# Patient Record
Sex: Female | Born: 1945 | Race: White | Hispanic: No | Marital: Married | Smoking: Former smoker
Health system: Southern US, Community
[De-identification: ages and names within clinical notes are randomized; demographics above are authoritative.]

## PROBLEM LIST (undated history)

## (undated) DIAGNOSIS — Z8489 Family history of other specified conditions: Secondary | ICD-10-CM

## (undated) DIAGNOSIS — Z9889 Other specified postprocedural states: Secondary | ICD-10-CM

## (undated) DIAGNOSIS — R112 Nausea with vomiting, unspecified: Secondary | ICD-10-CM

## (undated) DIAGNOSIS — C50919 Malignant neoplasm of unspecified site of unspecified female breast: Secondary | ICD-10-CM

## (undated) DIAGNOSIS — Z5189 Encounter for other specified aftercare: Secondary | ICD-10-CM

## (undated) DIAGNOSIS — I639 Cerebral infarction, unspecified: Secondary | ICD-10-CM

## (undated) DIAGNOSIS — E785 Hyperlipidemia, unspecified: Secondary | ICD-10-CM

## (undated) DIAGNOSIS — I10 Essential (primary) hypertension: Secondary | ICD-10-CM

## (undated) DIAGNOSIS — E119 Type 2 diabetes mellitus without complications: Secondary | ICD-10-CM

## (undated) HISTORY — DX: Essential (primary) hypertension: I10

## (undated) HISTORY — PX: BREAST BIOPSY: SHX20

## (undated) HISTORY — PX: TONSILLECTOMY: SUR1361

## (undated) HISTORY — DX: Encounter for other specified aftercare: Z51.89

## (undated) HISTORY — DX: Malignant neoplasm of unspecified site of unspecified female breast: C50.919

## (undated) HISTORY — DX: Cerebral infarction, unspecified: I63.9

---

## 1996-07-18 DIAGNOSIS — C50919 Malignant neoplasm of unspecified site of unspecified female breast: Secondary | ICD-10-CM

## 1996-07-18 DIAGNOSIS — Z5189 Encounter for other specified aftercare: Secondary | ICD-10-CM

## 1996-07-18 DIAGNOSIS — IMO0001 Reserved for inherently not codable concepts without codable children: Secondary | ICD-10-CM

## 1996-07-18 DIAGNOSIS — I639 Cerebral infarction, unspecified: Secondary | ICD-10-CM

## 1996-07-18 HISTORY — DX: Encounter for other specified aftercare: Z51.89

## 1996-07-18 HISTORY — DX: Cerebral infarction, unspecified: I63.9

## 1996-07-18 HISTORY — PX: BREAST LUMPECTOMY: SHX2

## 1996-07-18 HISTORY — DX: Reserved for inherently not codable concepts without codable children: IMO0001

## 1996-07-18 HISTORY — PX: BONE MARROW TRANSPLANT: SHX200

## 1996-07-18 HISTORY — DX: Malignant neoplasm of unspecified site of unspecified female breast: C50.919

## 2001-03-06 ENCOUNTER — Other Ambulatory Visit: Admission: RE | Admit: 2001-03-06 | Discharge: 2001-03-06 | Payer: Self-pay | Admitting: *Deleted

## 2004-06-25 ENCOUNTER — Encounter: Admission: RE | Admit: 2004-06-25 | Discharge: 2004-06-25 | Payer: Self-pay | Admitting: General Surgery

## 2005-07-05 ENCOUNTER — Encounter: Admission: RE | Admit: 2005-07-05 | Discharge: 2005-07-05 | Payer: Self-pay | Admitting: General Surgery

## 2005-07-18 HISTORY — PX: MASTECTOMY: SHX3

## 2005-08-12 ENCOUNTER — Encounter (INDEPENDENT_AMBULATORY_CARE_PROVIDER_SITE_OTHER): Payer: Self-pay | Admitting: *Deleted

## 2005-08-12 ENCOUNTER — Encounter (INDEPENDENT_AMBULATORY_CARE_PROVIDER_SITE_OTHER): Payer: Self-pay | Admitting: Diagnostic Radiology

## 2005-08-12 ENCOUNTER — Encounter: Admission: RE | Admit: 2005-08-12 | Discharge: 2005-08-12 | Payer: Self-pay | Admitting: General Surgery

## 2005-08-26 ENCOUNTER — Ambulatory Visit (HOSPITAL_COMMUNITY): Admission: RE | Admit: 2005-08-26 | Discharge: 2005-08-26 | Payer: Self-pay | Admitting: General Surgery

## 2005-09-08 ENCOUNTER — Encounter: Admission: RE | Admit: 2005-09-08 | Discharge: 2005-09-08 | Payer: Self-pay | Admitting: General Surgery

## 2005-09-12 ENCOUNTER — Ambulatory Visit (HOSPITAL_BASED_OUTPATIENT_CLINIC_OR_DEPARTMENT_OTHER): Admission: RE | Admit: 2005-09-12 | Discharge: 2005-09-13 | Payer: Self-pay | Admitting: General Surgery

## 2005-09-13 ENCOUNTER — Encounter (INDEPENDENT_AMBULATORY_CARE_PROVIDER_SITE_OTHER): Payer: Self-pay | Admitting: *Deleted

## 2005-10-05 ENCOUNTER — Ambulatory Visit: Payer: Self-pay | Admitting: Oncology

## 2005-11-21 ENCOUNTER — Encounter: Admission: RE | Admit: 2005-11-21 | Discharge: 2005-11-21 | Payer: Self-pay | Admitting: Oncology

## 2005-12-13 ENCOUNTER — Ambulatory Visit: Payer: Self-pay | Admitting: Oncology

## 2005-12-14 LAB — COMPREHENSIVE METABOLIC PANEL
ALT: 26 U/L (ref 0–40)
AST: 23 U/L (ref 0–37)
Albumin: 4.7 g/dL (ref 3.5–5.2)
Alkaline Phosphatase: 51 U/L (ref 39–117)
BUN: 7 mg/dL (ref 6–23)
CO2: 30 mEq/L (ref 19–32)
Calcium: 10 mg/dL (ref 8.4–10.5)
Chloride: 101 mEq/L (ref 96–112)
Creatinine, Ser: 0.7 mg/dL (ref 0.4–1.2)
Glucose, Bld: 169 mg/dL — ABNORMAL HIGH (ref 70–99)
Potassium: 4 mEq/L (ref 3.5–5.3)
Sodium: 139 mEq/L (ref 135–145)
Total Bilirubin: 0.8 mg/dL (ref 0.3–1.2)
Total Protein: 6.6 g/dL (ref 6.0–8.3)

## 2006-04-07 ENCOUNTER — Ambulatory Visit: Payer: Self-pay | Admitting: Oncology

## 2006-04-11 LAB — CBC WITH DIFFERENTIAL (CANCER CENTER ONLY)
BASO#: 0.1 10*3/uL (ref 0.0–0.2)
EOS%: 2.9 % (ref 0.0–7.0)
HGB: 13.8 g/dL (ref 11.6–15.9)
MCH: 29.5 pg (ref 26.0–34.0)
MCHC: 33.4 g/dL (ref 32.0–36.0)
MONO%: 6.9 % (ref 0.0–13.0)
NEUT#: 3.5 10*3/uL (ref 1.5–6.5)
NEUT%: 49.3 % (ref 39.6–80.0)
RDW: 12.4 % (ref 10.5–14.6)

## 2006-04-12 LAB — COMPREHENSIVE METABOLIC PANEL
ALT: 29 U/L (ref 0–40)
AST: 30 U/L (ref 0–37)
Albumin: 4.4 g/dL (ref 3.5–5.2)
Alkaline Phosphatase: 62 U/L (ref 39–117)
BUN: 13 mg/dL (ref 6–23)
CO2: 30 mEq/L (ref 19–32)
Calcium: 9.9 mg/dL (ref 8.4–10.5)
Chloride: 100 mEq/L (ref 96–112)
Creatinine, Ser: 0.8 mg/dL (ref 0.40–1.20)
Glucose, Bld: 107 mg/dL — ABNORMAL HIGH (ref 70–99)
Potassium: 3.8 mEq/L (ref 3.5–5.3)
Sodium: 135 mEq/L (ref 135–145)
Total Bilirubin: 1.3 mg/dL — ABNORMAL HIGH (ref 0.3–1.2)
Total Protein: 6.4 g/dL (ref 6.0–8.3)

## 2006-04-12 LAB — CANCER ANTIGEN 27.29: CA 27.29: 15 U/mL (ref 0–39)

## 2006-04-12 LAB — LACTATE DEHYDROGENASE: LDH: 162 U/L (ref 94–250)

## 2006-10-12 ENCOUNTER — Ambulatory Visit: Payer: Self-pay | Admitting: Oncology

## 2006-10-16 LAB — COMPREHENSIVE METABOLIC PANEL
ALT: 37 U/L — ABNORMAL HIGH (ref 0–35)
AST: 31 U/L (ref 0–37)
Albumin: 4.6 g/dL (ref 3.5–5.2)
Alkaline Phosphatase: 55 U/L (ref 39–117)
BUN: 12 mg/dL (ref 6–23)
CO2: 27 mEq/L (ref 19–32)
Calcium: 10.8 mg/dL — ABNORMAL HIGH (ref 8.4–10.5)
Chloride: 100 mEq/L (ref 96–112)
Creatinine, Ser: 0.76 mg/dL (ref 0.40–1.20)
Glucose, Bld: 134 mg/dL — ABNORMAL HIGH (ref 70–99)
Potassium: 4.1 mEq/L (ref 3.5–5.3)
Sodium: 139 mEq/L (ref 135–145)
Total Bilirubin: 0.9 mg/dL (ref 0.3–1.2)
Total Protein: 6.7 g/dL (ref 6.0–8.3)

## 2006-10-16 LAB — CBC WITH DIFFERENTIAL (CANCER CENTER ONLY)
EOS%: 2.8 % (ref 0.0–7.0)
MCH: 30 pg (ref 26.0–34.0)
MCHC: 33.4 g/dL (ref 32.0–36.0)
MONO%: 7.9 % (ref 0.0–13.0)
NEUT#: 4.3 10*3/uL (ref 1.5–6.5)
Platelets: 288 10*3/uL (ref 145–400)
RBC: 5.05 10*6/uL (ref 3.70–5.32)

## 2006-10-16 LAB — LACTATE DEHYDROGENASE: LDH: 163 U/L (ref 94–250)

## 2006-10-16 LAB — CANCER ANTIGEN 27.29: CA 27.29: 24 U/mL (ref 0–39)

## 2006-10-24 LAB — BASIC METABOLIC PANEL - CANCER CENTER ONLY
BUN, Bld: 12 mg/dL (ref 7–22)
CO2: 29 mEq/L (ref 18–33)
Calcium: 9.8 mg/dL (ref 8.0–10.3)
Chloride: 98 mEq/L (ref 98–108)
Creat: 0.8 mg/dl (ref 0.6–1.2)
Glucose, Bld: 129 mg/dL — ABNORMAL HIGH (ref 73–118)
Potassium: 4.2 mEq/L (ref 3.3–4.7)
Sodium: 134 mEq/L (ref 128–145)

## 2007-04-12 ENCOUNTER — Ambulatory Visit: Payer: Self-pay | Admitting: Oncology

## 2007-04-16 LAB — COMPREHENSIVE METABOLIC PANEL
ALT: 40 U/L — ABNORMAL HIGH (ref 0–35)
AST: 30 U/L (ref 0–37)
Albumin: 4.7 g/dL (ref 3.5–5.2)
Alkaline Phosphatase: 61 U/L (ref 39–117)
BUN: 11 mg/dL (ref 6–23)
CO2: 27 mEq/L (ref 19–32)
Calcium: 10.2 mg/dL (ref 8.4–10.5)
Chloride: 97 mEq/L (ref 96–112)
Creatinine, Ser: 0.72 mg/dL (ref 0.40–1.20)
Glucose, Bld: 214 mg/dL — ABNORMAL HIGH (ref 70–99)
Potassium: 4.5 mEq/L (ref 3.5–5.3)
Sodium: 137 mEq/L (ref 135–145)
Total Bilirubin: 1.2 mg/dL (ref 0.3–1.2)
Total Protein: 6.7 g/dL (ref 6.0–8.3)

## 2007-04-16 LAB — CBC WITH DIFFERENTIAL (CANCER CENTER ONLY)
BASO#: 0.1 10*3/uL (ref 0.0–0.2)
BASO%: 1.1 % (ref 0.0–2.0)
EOS%: 3 % (ref 0.0–7.0)
Eosinophils Absolute: 0.2 10*3/uL (ref 0.0–0.5)
HCT: 42.8 % (ref 34.8–46.6)
HGB: 14.3 g/dL (ref 11.6–15.9)
LYMPH#: 2.8 10*3/uL (ref 0.9–3.3)
LYMPH%: 38.1 % (ref 14.0–48.0)
MCH: 29.6 pg (ref 26.0–34.0)
MCHC: 33.5 g/dL (ref 32.0–36.0)
MCV: 88 fL (ref 81–101)
MONO#: 0.6 10*3/uL (ref 0.1–0.9)
MONO%: 7.7 % (ref 0.0–13.0)
NEUT#: 3.7 10*3/uL (ref 1.5–6.5)
NEUT%: 50.1 % (ref 39.6–80.0)
Platelets: 254 10*3/uL (ref 145–400)
RBC: 4.85 10*6/uL (ref 3.70–5.32)
RDW: 11.9 % (ref 10.5–14.6)
WBC: 7.4 10*3/uL (ref 3.9–10.0)

## 2007-04-16 LAB — LACTATE DEHYDROGENASE: LDH: 147 U/L (ref 94–250)

## 2007-04-16 LAB — CANCER ANTIGEN 27.29: CA 27.29: 25 U/mL (ref 0–39)

## 2007-08-13 ENCOUNTER — Encounter: Admission: RE | Admit: 2007-08-13 | Discharge: 2007-08-13 | Payer: Self-pay | Admitting: General Surgery

## 2007-11-09 ENCOUNTER — Ambulatory Visit: Payer: Self-pay | Admitting: Oncology

## 2007-11-13 LAB — COMPREHENSIVE METABOLIC PANEL
ALT: 30 U/L (ref 0–35)
AST: 27 U/L (ref 0–37)
Albumin: 4.8 g/dL (ref 3.5–5.2)
Alkaline Phosphatase: 60 U/L (ref 39–117)
BUN: 17 mg/dL (ref 6–23)
CO2: 22 mEq/L (ref 19–32)
Calcium: 9.8 mg/dL (ref 8.4–10.5)
Chloride: 100 mEq/L (ref 96–112)
Creatinine, Ser: 0.83 mg/dL (ref 0.40–1.20)
Glucose, Bld: 265 mg/dL — ABNORMAL HIGH (ref 70–99)
Potassium: 4.1 mEq/L (ref 3.5–5.3)
Sodium: 134 mEq/L — ABNORMAL LOW (ref 135–145)
Total Bilirubin: 1 mg/dL (ref 0.3–1.2)
Total Protein: 6.9 g/dL (ref 6.0–8.3)

## 2007-11-13 LAB — CBC WITH DIFFERENTIAL (CANCER CENTER ONLY)
BASO#: 0 10*3/uL (ref 0.0–0.2)
EOS%: 3.8 % (ref 0.0–7.0)
HCT: 42.1 % (ref 34.8–46.6)
HGB: 14.2 g/dL (ref 11.6–15.9)
LYMPH#: 2.6 10*3/uL (ref 0.9–3.3)
LYMPH%: 36.6 % (ref 14.0–48.0)
MCH: 28.9 pg (ref 26.0–34.0)
MCHC: 33.7 g/dL (ref 32.0–36.0)
MCV: 86 fL (ref 81–101)
NEUT%: 50.7 % (ref 39.6–80.0)

## 2007-11-13 LAB — CANCER ANTIGEN 27.29: CA 27.29: 26 U/mL (ref 0–39)

## 2008-05-26 ENCOUNTER — Ambulatory Visit: Payer: Self-pay | Admitting: Oncology

## 2008-05-27 LAB — CBC WITH DIFFERENTIAL (CANCER CENTER ONLY)
BASO#: 0.1 10*3/uL (ref 0.0–0.2)
BASO%: 1.1 % (ref 0.0–2.0)
Eosinophils Absolute: 0.2 10*3/uL (ref 0.0–0.5)
HCT: 41.6 % (ref 34.8–46.6)
HGB: 14.2 g/dL (ref 11.6–15.9)
LYMPH#: 2.5 10*3/uL (ref 0.9–3.3)
MCV: 88 fL (ref 81–101)
MONO#: 0.4 10*3/uL (ref 0.1–0.9)
NEUT%: 48 % (ref 39.6–80.0)
WBC: 6.3 10*3/uL (ref 3.9–10.0)

## 2008-05-27 LAB — CMP (CANCER CENTER ONLY)
ALT(SGPT): 32 U/L (ref 10–47)
AST: 34 U/L (ref 11–38)
Albumin: 4.4 g/dL (ref 3.3–5.5)
Alkaline Phosphatase: 62 U/L (ref 26–84)
BUN, Bld: 12 mg/dL (ref 7–22)
CO2: 29 mEq/L (ref 18–33)
Calcium: 10 mg/dL (ref 8.0–10.3)
Chloride: 100 mEq/L (ref 98–108)
Creat: 0.3 mg/dl — ABNORMAL LOW (ref 0.6–1.2)
Glucose, Bld: 156 mg/dL — ABNORMAL HIGH (ref 73–118)
Potassium: 4.1 mEq/L (ref 3.3–4.7)
Sodium: 133 mEq/L (ref 128–145)
Total Bilirubin: 1.1 mg/dl (ref 0.20–1.60)
Total Protein: 7.4 g/dL (ref 6.4–8.1)

## 2008-05-27 LAB — CANCER ANTIGEN 27.29: CA 27.29: 22 U/mL (ref 0–39)

## 2008-12-02 ENCOUNTER — Ambulatory Visit: Payer: Self-pay | Admitting: Oncology

## 2008-12-03 LAB — CMP (CANCER CENTER ONLY)
ALT(SGPT): 29 U/L (ref 10–47)
AST: 28 U/L (ref 11–38)
Albumin: 3.9 g/dL (ref 3.3–5.5)
Alkaline Phosphatase: 61 U/L (ref 26–84)
BUN, Bld: 13 mg/dL (ref 7–22)
CO2: 29 mEq/L (ref 18–33)
Calcium: 9.4 mg/dL (ref 8.0–10.3)
Chloride: 101 mEq/L (ref 98–108)
Creat: 0.8 mg/dl (ref 0.6–1.2)
Glucose, Bld: 169 mg/dL — ABNORMAL HIGH (ref 73–118)
Potassium: 4 mEq/L (ref 3.3–4.7)
Sodium: 139 mEq/L (ref 128–145)
Total Bilirubin: 0.9 mg/dl (ref 0.20–1.60)
Total Protein: 7.2 g/dL (ref 6.4–8.1)

## 2008-12-03 LAB — CBC WITH DIFFERENTIAL (CANCER CENTER ONLY)
BASO%: 1 % (ref 0.0–2.0)
Eosinophils Absolute: 0.3 10*3/uL (ref 0.0–0.5)
HCT: 41.2 % (ref 34.8–46.6)
LYMPH#: 2.5 10*3/uL (ref 0.9–3.3)
MONO#: 0.5 10*3/uL (ref 0.1–0.9)
Platelets: 253 10*3/uL (ref 145–400)
RBC: 4.7 10*6/uL (ref 3.70–5.32)
RDW: 12.3 % (ref 10.5–14.6)
WBC: 6.2 10*3/uL (ref 3.9–10.0)

## 2008-12-03 LAB — CANCER ANTIGEN 27.29: CA 27.29: 23 U/mL (ref 0–39)

## 2008-12-05 ENCOUNTER — Ambulatory Visit (HOSPITAL_COMMUNITY): Admission: RE | Admit: 2008-12-05 | Discharge: 2008-12-05 | Payer: Self-pay | Admitting: Oncology

## 2009-03-02 ENCOUNTER — Ambulatory Visit: Payer: Self-pay | Admitting: Genetic Counselor

## 2009-06-29 ENCOUNTER — Ambulatory Visit: Payer: Self-pay | Admitting: Oncology

## 2010-08-07 ENCOUNTER — Encounter: Payer: Self-pay | Admitting: General Surgery

## 2010-10-26 LAB — GLUCOSE, CAPILLARY: Glucose-Capillary: 162 mg/dL — ABNORMAL HIGH (ref 70–99)

## 2010-11-22 ENCOUNTER — Encounter (INDEPENDENT_AMBULATORY_CARE_PROVIDER_SITE_OTHER): Payer: Self-pay | Admitting: General Surgery

## 2010-12-03 NOTE — Op Note (Signed)
NAME:  Anna Frank, Anna Frank             ACCOUNT NO.:  [id number]   MEDICAL RECORD NO.:  [id number]          PATIENT TYPE:  AMB   LOCATION:  DSC                          FACILITY:  [facility]   PHYSICIAN:  Rose Phi. Maple Hudson, M.D.   DATE OF BIRTH:  1945/09/14   DATE OF PROCEDURE:  09/12/2005  DATE OF DISCHARGE:                                 OPERATIVE REPORT   PREOPERATIVE DIAGNOSIS:  Recurrent carcinoma of the right breast.   POSTOPERATIVE DIAGNOSIS:  Recurrent carcinoma of the right breast.   OPERATION:  Right total mastectomy.   SURGEON:  Dr. Francina Ames.   ANESTHESIA:  General.   OPERATIVE PROCEDURE:  After suitable general anesthesia was induced, the  patient was placed in the supine position with the arms extended on the arm  board. The right breast and chest area were prepped and draped in usual  fashion. A transverse elliptical incision was then outlined with a marking  pencil incorporating the nipple-areolar complex and as much of the previous  lumpectomy scar as I could. Incision was made and then we dissected the  flaps in the standard fashion. The superior flap was dissected to near the  level of the clavicle using the cautery and then we went medially to the  medial border of the sternum and then inferiorly to the inframammary fold at  the level of the rectus fascia and laterally to the latissimus dorsi muscle.   We then removed the breast by dissecting from medial to lateral  incorporating the pectoralis fascia and at the lateral margin, the breast  detached.   We had good hemostasis and thoroughly irrigated the field with saline.   One 19-French Harrison Mons drain was inserted and brought out through a separate  stab wound.  I then quilted down the upper flap with several 3-0 Vicryl  sutures.   The skin was then closed with staples and dressing applied. The patient  transferred to the recovery room in satisfactory condition having tolerated  procedure well.      Rose Phi.  Maple Hudson, M.D.  Electronically Signed     PRY/MEDQ  D:  09/12/2005  T:  09/13/2005  Job:  [id number]

## 2011-08-30 ENCOUNTER — Encounter (INDEPENDENT_AMBULATORY_CARE_PROVIDER_SITE_OTHER): Payer: Self-pay | Admitting: General Surgery

## 2011-08-30 ENCOUNTER — Ambulatory Visit (INDEPENDENT_AMBULATORY_CARE_PROVIDER_SITE_OTHER): Payer: Medicare Other | Admitting: General Surgery

## 2011-08-30 VITALS — BP 122/76 | HR 114 | Temp 97.5°F | Ht 62.5 in | Wt 145.8 lb

## 2011-08-30 DIAGNOSIS — Z853 Personal history of malignant neoplasm of breast: Secondary | ICD-10-CM | POA: Insufficient documentation

## 2011-08-30 NOTE — Patient Instructions (Signed)
Your physical exam today is negative for any signs of cancer on the right or the left.  I think that you are a high risk patient for developing cancer in the left breast, however. I agree with Dr. Yolanda Bonine that you should make an appointment to see Dr. Marikay Alar Magrinat for review of your breast cancer history and further discussion of your high risk status.  I will try to get the MRI, MRI report, MRI biopsy report, and the reports from the open biopsy that Dr. Ashley Royalty performed in 2012.  You Will get a left breast mammogram in 6 months, and I will see you at that time.

## 2011-08-30 NOTE — Progress Notes (Addendum)
Patient ID: Anna Frank, female   DOB: [birth year], 66 y.o.   MRN: [id number]  Chief Complaint  Patient presents with  . Breast Cancer Long Term Follow Up    HPI Anna Frank is a 66 y.o. female.  She returns for a long-term followup of her right breast cancer.  This patient was originally a patient of Francina Ames, and had a right partial mastectomy and axillary lymph node dissection in July of 1998 for a stage T2 N2 carcinoma. She had stem cell transplant along with chemotherapy and Femara following a right partial mastectomy. Following all of that she developed small recurrence and underwent right total mastectomy by Francina Ames in February 2007. She has no known recurrent disease today.  She has been followed by Dr. Dossie Der in medical oncology at [facility]. She has been followed by Jeralyn Ruths. Her primary care physician is in [place].  I reviewed some notes from [facility] office and it appears that in May of 2012 she had an MRI and did an MRI guided biopsy. It appeared to Dr. Ashley Royalty did an  open biopsy revealing  lobular neoplasia. I do not have any details of that.  She had a left breast mammogram at [facility] on Feb.5,2013 which shows dense breasts, linear density at the site of recent biopsy, BI-RADS category 3. Six-month followup recommended.  She states that Dr. Yolanda Bonine has recommended that she see Dr. Marikay Alar Magrinat for an opinion.   Apparently Dr. Yolanda Bonine has requested records from that but I do not have the records. She and her husband are here today. She has no new complaints about her breast. HPI  Past Medical History  Diagnosis Date  . Blood transfusion   . Cancer   . Diabetes mellitus   . Hypertension   . Stroke     Past Surgical History  Procedure Date  . Breast surgery 1998    lumpectomy  . Breast surgery 2007    mastectomy    Family History  Problem Relation Age of Onset  . Heart disease Mother   . Cancer Sister    ovarian    Social History History  Substance Use Topics  . Smoking status: Former Games developer  . Smokeless tobacco: Former Neurosurgeon    Quit date: 08/29/1996  . Alcohol Use: Yes     3 per week    No Known Allergies  Current Outpatient Prescriptions  Medication Sig Dispense Refill  . Ascorbic Acid (VITAMIN C) 1000 MG tablet Take 1,000 mg by mouth daily.      Marland Kitchen aspirin 81 MG tablet Take 81 mg by mouth daily.      Marland Kitchen b complex vitamins tablet Take 1 tablet by mouth daily.      . Ca Phosphate-Cholecalciferol (CALTRATE GUMMY BITES PO) Take 600 mg by mouth daily.      . cholecalciferol (VITAMIN D) 1000 UNITS tablet Take 1,000 Units by mouth daily.      Marland Kitchen exemestane (AROMASIN) 25 MG tablet Take 25 mg by mouth daily after breakfast.        . fish oil-omega-3 fatty acids 1000 MG capsule Take 1 g by mouth daily.      Marland Kitchen glimepiride (AMARYL) 2 MG tablet Take 2 mg by mouth daily before breakfast.        . LISINOPRIL PO Take 25 mg by mouth daily.        Marland Kitchen METFORMIN HCL PO Take 2,000 mg by mouth 2 (two) times daily.        Marland Kitchen  Pioglitazone HCl (ACTOS PO) Take 15 mg by mouth daily.        Marland Kitchen SIMVASTATIN PO Take by mouth daily.        . sitaGLIPtan (JANUVIA) 100 MG tablet Take 100 mg by mouth daily.        . vitamin E 1000 UNIT capsule Take 1,000 Units by mouth daily.        Review of Systems Review of Systems 12 system review of systems is performed and is negative except as described above. Blood pressure 122/76, pulse 114, temperature 97.5 F (36.4 C), temperature source Temporal, height 5' 2.5" (1.588 m), weight 145 lb 12.8 oz (66.134 kg), SpO2 98.00%.  Physical Exam Physical Exam  Constitutional: She is oriented to person, place, and time. She appears well-developed and well-nourished. No distress.  Neck: Normal range of motion. Neck supple. No JVD present. No tracheal deviation present. No thyromegaly present.  Cardiovascular: Normal rate and regular rhythm.   Pulmonary/Chest:     Lymphadenopathy:    She has no cervical adenopathy.  Neurological: She is alert and oriented to person, place, and time. Coordination normal.  Skin: Skin is warm and dry. No rash noted. No erythema. No pallor.  Psychiatric: She has a normal mood and affect. Her behavior is normal. Judgment and thought content normal.    Data Reviewed I have reviewed the recent mammogram and mammogram reports. I reviewed a clinical summary from Dr. Yolanda Bonine Re: the investigations at [facility] last year. I reviewed our old records.  Assessment    History right partial mastectomy and axillary lymph node dissection 1998 for pathologic stage TII N2 cancer.  Status post stem cell transplant.  Status post right total mastectomy 2007 for recurrent cancer. No evidence of recurrent cancer right chest wall to date.  Undocumented history of left breast biopsy with showing lobular neoplasia at [facility] in May 2012  Recent left mammogram at [facility] looks okay, BI-RADS category 3, a Graves 6 month followup  This is a high-risk patient.    Plan    The patient is going to call for an appointment to see Dr. Marikay Alar Magrinat electively regarding her high risk status for breast cancer.  Left breast mammogram in 6 months  We will attempt to get all the records from [facility].  ADDENDUM (09/05/11) - records received from [facility].     Left Breast biopsy performed 11/27/10.    Pathology shows Atypical Lobular Hyperplasia, sclerosing adenosis, FCD. No in-situ or invasive cancer identified.       MRI performed 04/26/11 shows no evidence of recurrence in right mastectomy bed, post surgical changes left breast, no suspicious lesions.  Perhaps she should have another MRI in about 6 months.  Unless I learn something new from her records, there does not appear to be any indication for further surgery on her left breast at this time. Close clinical followup is warranted, however, because of her high risk status       Nicolo Tomko  M 08/30/2011, 2:43 PM

## 2011-09-01 ENCOUNTER — Encounter (INDEPENDENT_AMBULATORY_CARE_PROVIDER_SITE_OTHER): Payer: Self-pay

## 2011-09-02 ENCOUNTER — Telehealth (INDEPENDENT_AMBULATORY_CARE_PROVIDER_SITE_OTHER): Payer: Self-pay

## 2011-09-02 NOTE — Telephone Encounter (Signed)
Received records from [facility] and to [facility] to sign.

## 2011-09-26 ENCOUNTER — Encounter (INDEPENDENT_AMBULATORY_CARE_PROVIDER_SITE_OTHER): Payer: Self-pay

## 2011-09-27 ENCOUNTER — Encounter (INDEPENDENT_AMBULATORY_CARE_PROVIDER_SITE_OTHER): Payer: Self-pay

## 2012-02-28 ENCOUNTER — Other Ambulatory Visit: Payer: Self-pay | Admitting: Radiology

## 2012-02-29 ENCOUNTER — Ambulatory Visit (INDEPENDENT_AMBULATORY_CARE_PROVIDER_SITE_OTHER): Payer: Medicare Other | Admitting: General Surgery

## 2012-02-29 ENCOUNTER — Other Ambulatory Visit (INDEPENDENT_AMBULATORY_CARE_PROVIDER_SITE_OTHER): Payer: Self-pay | Admitting: General Surgery

## 2012-02-29 ENCOUNTER — Encounter (INDEPENDENT_AMBULATORY_CARE_PROVIDER_SITE_OTHER): Payer: Self-pay | Admitting: General Surgery

## 2012-02-29 VITALS — BP 132/80 | HR 80 | Temp 97.0°F | Resp 16 | Ht 62.5 in | Wt 154.0 lb

## 2012-02-29 DIAGNOSIS — D4860 Neoplasm of uncertain behavior of unspecified breast: Secondary | ICD-10-CM | POA: Insufficient documentation

## 2012-02-29 DIAGNOSIS — Z853 Personal history of malignant neoplasm of breast: Secondary | ICD-10-CM

## 2012-02-29 NOTE — Patient Instructions (Signed)
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 Considering the history of locally advanced cancer of the right breast, your stem cell transplant, and a strong family history of breast cancer, I agree with you that it is reasonable to consider prophylactic left mastectomy.  We will tentatively schedule you for a left total mastectomy with sentinel node biopsy. At your request we're going to schedule this in October.  We have discussed long-term medical oncology evaluation and you have requested referral back to Dr. Glenford Peers, and so we will arrange for an office visit with him before we do the prophylactic mastectomy. You can discuss management and risk reduction with him and discussed the Aromasin use with him.  Return to see Dr. Derrell Lolling in 4 weeks, after the visit with Dr. Mariel Sleet but before the surgery.     Total or Modified Radical Mastectomy  Care After Refer to this sheet in the next few weeks. These instructions provide you with information on caring for yourself after your procedure. Your caregiver may also give you more specific instructions. Your treatment has been planned according to current medical practices, but problems sometimes occur. Call your caregiver if you have any problems or questions after your procedure. ACTIVITY  Your caregiver will advise you when you may resume strenuous activities, driving, and sports.   After the drain(s) are removed, you may do light housework. Avoid heavy lifting, carrying, or pushing. You should not be lifting anything heavier than 5 lbs.   Take frequent rest periods. You may tire more easily than usual.   Always rest and elevate the arm affected by your surgery for a period of time equal to your activity time.   Continue doing the exercises given to you by the physical therapist/occupational therapist even after full range of motion has returned. The amount of time this takes will vary from person to person.   After  normal range of motion has returned, some stiffness and soreness may persist for 2-3 months. This is normal and will subside.   Begin sports or strenuous activities in moderation. This will give you a chance to rebuild your endurance. Continue to be cautious of heavy lifting or carrying (no more than 10 lbs.) with your affected arm.   You may return to work as recommended by your caregiver.  NUTRITION  You may resume your normal diet.   Make sure you drink plenty of fluids (6-8 glasses a day).   Eat a well-balanced diet. Including daily portions of food from government recommended food groups:   Grains.   Vegetables.   Fruits.   Milk.   Meat & beans.   Oils.  Visit DateT[employer].nl for more information HYGIENE  You may wash your hair.   If your incision (cut from surgery) is closed, you may shower or tub bathe, unless instructed otherwise by your doctor.  FEVER  If you feel feverish or have shaking chills, take your temperature. If your temperature is 102 F (38.9 C) or above, call your caregiver. The fever may mean there is an infection.   If you call early, infection can be treated with antibiotics and hospitalization may be avoided.  PAIN CONTROL  Mild discomfort may occur.   You may need to take an over-the-counter pain medication or a medication prescribed by your caregiver.   Call your caregiver if you experience increased pain.  INCISION CARE  Check your incision daily for increased redness, drainage, swelling, or separation of skin.   Call your  caregiver if any of the above are noted.  ARM AND HAND CARE  If the lymph nodes under your arm were removed with a modified radical mastectomy, there may be a greater tendency for the arm to swell.   Try to avoid having blood pressures taken, blood drawn, or injections given in the affected arm. This is the arm on the same side as the surgery.   Use hand lotion to soften cuticles instead of cutting them to  avoid cutting yourself.   Be careful when shaving your under arms. Use an electric shaver if possible. You may use a deodorant after the incision has completely healed. Until then, clean under your arms with hydrogen peroxide.   Use reasonable precaution when cooking, sewing, and gardening to avoid burning or needle or thorn pricks.   Do not weigh your arm straight down with a package or your purse.   Follow the exercises and instructions given to you by the physical therapist/occupational therapist and your caregiver.  FOLLOW-UP APPOINTMENT Call your caregiver for a follow-up appointment as directed. PROSTHESIS INFORMATION Wear your temporary prosthesis (artificial breast) until your caregiver gives you permission to purchase a permanent one. This will depend upon your rate of healing. We suggest you also wait until you are physically and emotionally ready to shop for one. The suitability depends on several individual factors. We do not endorse any particular prosthesis, but suggest you try several until you are satisfied with appearance and fit. A list of stores may be obtained from your local [employer] at www.cancer.org or 1-800-ACS-2345 (1-567 151 2814). A permanent prosthesis is medically necessary to restore balance. It is also income tax deductible. Be sure all receipts are marked "surgical". It is not essential to purchase a bra. You may sew a pocket into your regular bra. Note: Remember to take all of your medical insurance information with you when shopping for your prosthesis. SELECTING A PROSTHESIS FITTER You may want to ask the following questions when selecting a fitter:  What styles and brands of forms are carried in stock?   How long have the forms been on the market and have there been any problems with them?   Why would one form be better than another?   How long should a particular form last?   May I wear the form for a trial period without obligation?    Do the forms require a prosthetic bra? If so, what is the price range? Must I always wear that style?   If alterations to the bra are necessary, can they be done at this location or be sent out?   Will I be charged for alterations?   Will I receive suggestions on how to alter my own wardrobe, if necessary?   Will you special order forms or bras if necessary?   Are fitters always available to meet my needs?   What kinds of garments should be worn for the fitting?   Are lounge wear, swim wear, and accessories available?   If I have insurance coverage or Medicare, will you suggest ways for processing the paper work?   Do you keep complete records so that mail reordering is possible?   How are warranty claims handled if I have a problem with the form?  Document Released: 02/25/2004 Document Revised: 06/23/2011 Document Reviewed: 10/30/2007 [facility] Patient Information 2012 [place], [place].Mastectomy, With or Without Reconstruction Mastectomy (removal of the breast) is a procedure most commonly used to treat cancer (tumor) of the breast. Different procedures  are available for treatment. This depends on the stage of the tumor (abnormal growths). Discuss this with your caregiver, surgeon (a specialist for performing operations such as this), or oncologist (someone specialized in the treatment of cancer). With proper information, you can decide which treatment is best for you. Although the sound of the word cancer is frightening to all of [place], the new treatments and medications can be a source of reassurance and comfort. If there are things you are worried about, discuss them with your caregiver. He or she can help comfort you and your family. Some of the different procedures for treating breast cancer are:  Radical (extensive) mastectomy. This is an operation used to remove the entire breast, the muscles under the breast, and all of the glands (lymph nodes) under the arm. With all of the new  treatments available for cancer of the breast, this procedure has become less common.   Modified radical mastectomy. This is a similar operation to the radical mastectomy described above. In the modified radical mastectomy, the muscles of the chest wall are not removed unless one of the lessor muscles is removed. One of the lessor muscles may be removed to allow better removal of the lymph nodes. The axillary lymph nodes are also removed. Rarely, during an axillary node dissection nerves to this area are damaged. Radiation therapy is then often used to the area following this surgery.   A total mastectomy also known as a complete or simple mastectomy. It involves removal of only the breast. The lymph nodes and the muscles are left in place.   In a lumpectomy, the lump is removed from the breast. This is the simplest form of surgical treatment. A sentinel lymph node biopsy may also be done. Additional treatment may be required.  RISKS AND COMPLICATIONS The main problems that follow removal of the breast include:  Infection (germs start growing in the wound). This can usually be treated with antibiotics (medications that kill germs).   Lymphedema. This means the arm on the side of the breast that was operated on swells because the lymph (tissue fluid) cannot follow the main channels back into the body. This only occurs when the lymph nodes have had to be removed under the arm.   There may be some areas of numbness to the upper arm and around the incision (cut by the surgeon) in the breast. This happens because of the cutting of or damage to some of the nerves in the area. This is most often unavoidable.   There may be difficultymoving the arm in a full range of motion (moving in all directions) following surgery. This usually improves with time following use and exercise.   Recurrence of breast cancer may happen with the very best of surgery and follow up treatment. Sometimes small cancer cells that  cannot be seen with the naked eye have already spread at the time of surgery. When this happens other treatment is available. This treatment may be radiation, medications or a combination of both.  RECONSTRUCTION Reconstruction of the breast may be done immediately if there is not going to be post-operative radiation. This surgery is done for cosmetic (improve appearance) purposes to improve the physical appearance after the operation. This may be done in two ways:  It can be done using a saline filled prosthetic (an artificial breast which is filled with salt water). Silicone breast implants are now re-approved by the FDA and are being commonly used.   Reconstruction can be done using  the body's own muscle/fat/skin.  Your caregiver will discuss your options with you. Depending upon your needs or choice, together you will be able to determine which procedure is best for you. Document Released: 03/29/2001 Document Revised: 06/23/2011 Document Reviewed: 11/20/2007 [facility] Patient Information 2012 [place], [place].

## 2012-02-29 NOTE — Progress Notes (Signed)
Patient ID: [place] B Krumholz, female   DOB: 1945-11-17, 66 y.o.   MRN: [id number]  Chief Complaint  Patient presents with  . Breast Cancer Long Term Follow Up    HPI [place] B Verrastro is a 66 y.o. female.  She is referred back to me by Dr. 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  This patient has a significant history of cancer of the right breast. In July of 1998 she underwent right partial mastectomy and axillary lymph node dissection for stage T2, N2 carcinoma by Francina Ames. She recurred and underwent right total mastectomy by Dr. Maple Hudson on 09/12/2005, and had stem cell transplant along with chemotherapy and Femara at [facility] by Dr. Kandice Hams. She continues to be followed at [facility] intermittently. She also sees me for surgical followup. She's also had surgical procedures by Dr. Ashley Royalty.   I last saw her on 08/23/2010 at which time her mammograms of the left breast looked good and there was no evidence of recurrence.  In  may 2012 she underwent left breast biopsy by Dr. Michel Harrow at [facility]. She had 3 separate foci of atypical lobular neoplasia. Close followup surveillance was recommended.  She recently went for screening mammogram. Dr. Yolanda Bonine called me about this patient ultrasounds as well showing a density in the left breast at the 11:00 position, was a 1 cm in diameter, 4 cm from the nipple. Gamma imaging showed focal uptake in the same area. Recent imaging biopsy shows atypical lobular neoplasia, but no in situ or invasive cancer.  The patient is here today with her husband to discuss strategies. She is frustrated by recurrent problems and is contemplating mastectomy.  Past history significant for a stroke in 1998 from which she recovered, she was smoking at that time but does not smoke any more. She has hypertension and non-insulin-dependent diabetes.  Family history is significant for breast cancer in 5 cousins and  ovarian cancer in one sister. She says she has been tested for BRCA and is negative.  She continues to take aromasin and questions the value of that. HPI  Past Medical History  Diagnosis Date  . Blood transfusion   . Cancer   . Diabetes mellitus   . Hypertension   . Stroke     Past Surgical History  Procedure Date  . Breast surgery 1998    lumpectomy  . Breast surgery 2007    mastectomy    Family History  Problem Relation Age of Onset  . Heart disease Mother   . Cancer Sister     ovarian    Social History History  Substance Use Topics  . Smoking status: Former Games developer  . Smokeless tobacco: Former Neurosurgeon    Quit date: 08/29/1996  . Alcohol Use: Yes     3 per week    No Known Allergies  Current Outpatient Prescriptions  Medication Sig Dispense Refill  . Ascorbic Acid (VITAMIN C) 1000 MG tablet Take 1,000 mg by mouth daily.      Marland Kitchen aspirin 81 MG tablet Take 81 mg by mouth daily.      Marland Kitchen b complex vitamins tablet Take 1 tablet by mouth daily.      . Ca Phosphate-Cholecalciferol (CALTRATE GUMMY BITES PO) Take 600 mg by mouth daily.      . cholecalciferol (VITAMIN D) 1000 UNITS tablet Take 1,000 Units by mouth daily.      Marland Kitchen exemestane (AROMASIN) 25 MG tablet Take 25 mg by  mouth daily after breakfast.        . glimepiride (AMARYL) 2 MG tablet Take 2 mg by mouth daily before breakfast.        . LISINOPRIL PO Take 25 mg by mouth daily.        Marland Kitchen METFORMIN HCL PO Take 2,000 mg by mouth 2 (two) times daily.        . Pioglitazone HCl (ACTOS PO) Take 15 mg by mouth daily.        Marland Kitchen SIMVASTATIN PO Take by mouth daily.        . vitamin E 1000 UNIT capsule Take 1,000 Units by mouth daily.      . fish oil-omega-3 fatty acids 1000 MG capsule Take 1 g by mouth daily.      . sitaGLIPtan (JANUVIA) 100 MG tablet Take 100 mg by mouth daily.          Review of Systems Review of Systems  Constitutional: Negative for fever, chills and unexpected weight change.  HENT: Negative for hearing  loss, congestion, sore throat, trouble swallowing and voice change.   Eyes: Negative for visual disturbance.  Respiratory: Negative for cough and wheezing.   Cardiovascular: Negative for chest pain, palpitations and leg swelling.  Gastrointestinal: Negative for nausea, vomiting, abdominal pain, diarrhea, constipation, blood in stool, abdominal distention and anal bleeding.  Genitourinary: Negative for hematuria, vaginal bleeding and difficulty urinating.  Musculoskeletal: Negative for arthralgias.  Skin: Negative for rash and wound.  Neurological: Negative for seizures, syncope and headaches.  Hematological: Negative for adenopathy. Does not bruise/bleed easily.  Psychiatric/Behavioral: Negative for confusion.    Blood pressure 132/80, pulse 80, temperature 97 F (36.1 C), temperature source Temporal, resp. rate 16, height 5' 2.5" (1.588 m), weight 154 lb (69.854 kg).  Physical Exam Physical Exam  Constitutional: She is oriented to person, place, and time. She appears well-developed and well-nourished. No distress.  HENT:  Head: Normocephalic and atraumatic.  Nose: Nose normal.  Mouth/Throat: No oropharyngeal exudate.  Eyes: Conjunctivae and EOM are normal. Pupils are equal, round, and reactive to light. Left eye exhibits no discharge. No scleral icterus.  Neck: Neck supple. No JVD present. No tracheal deviation present. No thyromegaly present.  Cardiovascular: Normal rate, regular rhythm, normal heart sounds and intact distal pulses.   No murmur heard. Pulmonary/Chest: Effort normal and breath sounds normal. No respiratory distress. She has no wheezes. She has no rales. She exhibits no tenderness.       Right mastectomy scar well healed. No nodules or ulcerations. No axillary adenopathy. Good range of motion right shoulder. No swelling.  left breast reveals multiple well-healed scars, no palpable mass, no axillary adenopathy.  Abdominal: Soft. Bowel sounds are normal. She exhibits no  distension and no mass. There is no tenderness. There is no rebound and no guarding.  Musculoskeletal: She exhibits no edema and no tenderness.  Lymphadenopathy:    She has no cervical adenopathy.  Neurological: She is alert and oriented to person, place, and time. She exhibits normal muscle tone. Coordination normal.  Skin: Skin is warm. No rash noted. She is not diaphoretic. No erythema. No pallor.  Psychiatric: She has a normal mood and affect. Her behavior is normal. Judgment and thought content normal.    Data Reviewed I have reviewed her mammograms, ultrasound, BSGI., pathology report, old records, pathology report from [facility] from 2012, and had a  phone conversation with Dr. Yolanda Bonine.  Assessment    Recurrent atypical lobular neoplasia left breast, most recent  focus is at the 11:00 position of the left breast.  Past history right partial mastectomy and lymph node dissection(1998), stem cell transplant, recurrence, and salvage right mastectomy(2007) as described above. No evidence of local recurrence on the right  Past history tobacco use,  Past history cerebro- vascular accident 1998  Hypertension  Non insulin dependent diabetes  Strong family history of breast cancer, but was negative for personal genetic testing for BRCA.    Plan    Very long discussion with patient and husband. At the end of discussion she wants to go ahead and tentatively schedule for left total mastectomy and sentinel node biopsy without reconstruction.   She wants to be referred to Dr. Glenford Peers, who she has seen in the past, for discussion of medical oncology issues, and we will arrange for that.  She will return to see me in one month, prior to her mastectomy for final discussion  We discussed reconstructive issues and she is not interested in  reconstruction at this time. I told her that that remains an option either now or in the future.       Angelia Mould. Derrell Lolling, M.D., [facility] General and Minimally invasive Surgery Breast and Colorectal Surgery Office:   [phone] Pager:   [phone]  02/29/2012, 1:07 PM

## 2012-02-29 NOTE — Progress Notes (Signed)
Faxed copy of referral and progress notes to Dr. Thornton Papas office. Fax # [phone].

## 2012-03-01 ENCOUNTER — Other Ambulatory Visit (INDEPENDENT_AMBULATORY_CARE_PROVIDER_SITE_OTHER): Payer: Self-pay | Admitting: General Surgery

## 2012-03-01 ENCOUNTER — Encounter (INDEPENDENT_AMBULATORY_CARE_PROVIDER_SITE_OTHER): Payer: Self-pay

## 2012-03-01 DIAGNOSIS — D4860 Neoplasm of uncertain behavior of unspecified breast: Secondary | ICD-10-CM

## 2012-03-01 DIAGNOSIS — Z853 Personal history of malignant neoplasm of breast: Secondary | ICD-10-CM

## 2012-03-14 ENCOUNTER — Encounter (HOSPITAL_COMMUNITY): Payer: Medicare Other | Attending: Oncology | Admitting: Oncology

## 2012-03-14 ENCOUNTER — Encounter (HOSPITAL_COMMUNITY): Payer: Self-pay | Admitting: Oncology

## 2012-03-14 VITALS — BP 136/84 | HR 87 | Temp 97.8°F | Resp 18 | Ht 62.5 in | Wt 155.1 lb

## 2012-03-14 DIAGNOSIS — Z853 Personal history of malignant neoplasm of breast: Secondary | ICD-10-CM

## 2012-03-14 DIAGNOSIS — D4860 Neoplasm of uncertain behavior of unspecified breast: Secondary | ICD-10-CM

## 2012-03-14 NOTE — Progress Notes (Signed)
Problem #1 breast cancer  This very pleasant lady I saw in 1998 when she was diagnosed with invasive lobular carcinoma with multiple positive nodes giving her T2, N2 disease. She was referred for evaluation and protocol inclusion to [facility] to Dr Corky Mull. She underwent transplant after chemotherapy followed by radiation therapy followed by 5 years of letrozole hormonal therapy. She was followed as well by Dr. Dossie Der. She then had a breast recurrence of invasive lobular carcinoma with 2 areas of disease found 6 mm and 4 mm respectively, grade 2, estrogen receptor positive and progesterone receptor negative. No LV I was seen. HER-2/neu was non-over expressed. Her proliferation rate was felt to be low. She underwent the salvage mastectomy on September 13 2005. She was then started on Aromasin therapy and she has been on that ever since. She has completed over 6-1/2 years of this therapy.  In 2012 she had 3 lesions biopsied from the left breast by Dr. Barbaraann Cao. at [facility] all of which showed atypical lobular hyperplasia. She recently was found to have another atypical area on mammography and after extensive workup by she also showed the above atypical lobular hyperplasia. She is wondering whether she needs to have a mastectomy. She comes today with her husband and 2 articles that she has found from her research. She is not sure that she was BRCA1 and BRCA2 testing but thinks that she might have been. We are going to try to retrieve those records if possible.  She and her husband have been married many years and they remain close and still lives in the [place] area. She does not smoke. She does use alcohol socially. She did quit smoking in 1998.  Family history is positive for a sister who died of ovarian cancer in her early 37s. She also has 4 first cousins who have had breast cancer one of whom has died from the disease and was diagnosed in her 21s to the best of her recollection.  Her  vital signs are recorded. She is in no acute distress. She is alert and oriented. Her right chest wall shows the scars but no nodularity. She has no lymphadenopathy in the cervical supraclavicular infraclavicular axillary or inguinal areas. She has clear lung fields bilaterally. Her left breast does not reveal any masses but she does have a small ecchymosis medially located a scar inferiorly and recent puncture mark from a biopsy. She has a normal heart exam without murmur rub or gallop. Abdomen is soft nontender slightly obese without hepatosplenomegaly. Bowel sounds are normal. She does not have decreased range of motion of the right shoulder nor is there swelling  She is alert and oriented as mentioned above with normal cranial nerves II through XII. Pupils are equally round and reactive to light throat is clear teeth are in good repair.  This lady's options are 1. to have a mastectomy in the hopes that this can alleviate multiple biopsies in the future though there is no guarantee she will need more biopsies going forward. The second option is to just observe her as is being done by Dr. Derrell Lolling and her mammographer, Dr. Yolanda Bonine. She knows that this may need to include followup biopsies should they find anything that changes. She has already had 4 areas of [facility] identified in the last 2 years.  I have also told her that I do not think she needs to continue Aromasin in definitely. She will think about these options and we'll try to get some notes from Dr.  Blackwell at [facility] and find out if she ever had genetic testing and I've call our geneticist to look into her old records in [place].  If she has not had testing I think she would qualify for it with his family history. I will see her in 4-5 weeks but she will continue to keep her appointment with Dr. Derrell Lolling and she will think about what she wants to do in the interim. She knows that there is no urgency to her decision making at this time since there is  no active cancer in the left breast that has been identified. Dr. Derrell Lolling and I will discuss her after he sees her.

## 2012-03-14 NOTE — Patient Instructions (Addendum)
Anna Frank  DOB 1946/05/03 CSN [id number]  MRN [id number] Dr. Glenford Peers   [facility]  Discharge Instructions  RECOMMENDATIONS MADE BY THE CONSULTANT AND ANY TEST RESULTS WILL BE SENT TO YOUR REFERRING DOCTOR.   EXAM FINDINGS BY MD TODAY AND SIGNS AND SYMPTOMS TO REPORT TO CLINIC OR PRIMARY MD: Exam and discussion by Dr. Mariel Sleet.  You have had atypical lobular hyperplasia 4 times now.  We have time to make a more informed decision.  Need to get your [facility] 1 AND BRAC 2 results.  You can stop the Aromasin.  You have already taken it for 5 years and research does not show any benefit from taking it longer than that.  We will try to get your records from [place].  MEDICATIONS PRESCRIBED: none   INSTRUCTIONS GIVEN AND DISCUSSED: Other :  Report any new lumps, bone pain or shortness of breath.  SPECIAL INSTRUCTIONS/FOLLOW-UP: Return to Clinic in 3 - 4 weeks for follow-up with Dr. Lytle Butte.   I acknowledge that I have been informed and understand all the instructions given to me and received a copy. I do not have any more questions at this time, but understand that I may call the Specialty Clinic at [facility] at [phone] during business hours should I have any further questions or need assistance in obtaining follow-up care.    __________________________________________  _____________  __________ Signature of Patient or Authorized Representative            Date                   Time    __________________________________________ Nurse's Signature

## 2012-03-22 ENCOUNTER — Telehealth: Payer: Self-pay | Admitting: *Deleted

## 2012-03-22 NOTE — Telephone Encounter (Signed)
Left message for pt to return my call so I can schedule a genetic appt.  

## 2012-03-22 NOTE — Telephone Encounter (Signed)
Patient returned my call and I confirmed 03/27/12 genetic appt w/ pt.

## 2012-03-22 NOTE — Telephone Encounter (Signed)
Patient called back stating her husband has an appt.  I rescheduled and confirmed 03/27/12 at 9:30 w/ pt.

## 2012-03-27 ENCOUNTER — Ambulatory Visit (HOSPITAL_BASED_OUTPATIENT_CLINIC_OR_DEPARTMENT_OTHER): Payer: Medicare Other | Admitting: Genetic Counselor

## 2012-03-27 ENCOUNTER — Encounter: Payer: Self-pay | Admitting: Genetic Counselor

## 2012-03-27 ENCOUNTER — Other Ambulatory Visit: Payer: Medicare Other | Admitting: Lab

## 2012-03-27 ENCOUNTER — Encounter: Payer: Medicare Other | Admitting: Genetic Counselor

## 2012-03-27 DIAGNOSIS — Z8051 Family history of malignant neoplasm of kidney: Secondary | ICD-10-CM

## 2012-03-27 DIAGNOSIS — Z803 Family history of malignant neoplasm of breast: Secondary | ICD-10-CM

## 2012-03-27 DIAGNOSIS — Z853 Personal history of malignant neoplasm of breast: Secondary | ICD-10-CM

## 2012-03-27 NOTE — Progress Notes (Signed)
Dr.  Glenford Peers requested a consultation for genetic counseling and risk assessment for Anna Frank, a 66 y.o. female, for discussion of her personal history of breast cancer and family history of ovarian, kidney,breast and prostate cancer. She presents to clinic today, with her husband, to discuss the possibility of a genetic predisposition to cancer, and to further clarify her risks, as well as her family members' risks for cancer.   HISTORY OF PRESENT ILLNESS: In 1998, at the age of 62, Anna Frank was diagnosed with invasive lobular carcinoma of the right breast. This was treated with lumpectomy and stem cell transplant which was performed at [facility].  In 2006, at the age of 56, [name] was diagnosed with invasive lobular carcinoma of the right breast.    Past Medical History  Diagnosis Date  . Blood transfusion   . Cancer   . Diabetes mellitus   . Hypertension   . Stroke   . Breast cancer     Past Surgical History  Procedure Date  . Breast surgery 1998    lumpectomy  . Breast surgery 2007    mastectomy  . Mastectomy 1998    right breast    History  Substance Use Topics  . Smoking status: Former Smoker -- 1.0 packs/day for 20 years  . Smokeless tobacco: Former Neurosurgeon    Quit date: 08/29/1996  . Alcohol Use: Yes     3 per week    REPRODUCTIVE HISTORY AND PERSONAL RISK ASSESSMENT FACTORS: Menarche was at age 40.   Menopause at 50 Uterus Intact: Yes Ovaries Intact: Yes [id number] , first live birth at age N/A  She has not previously undergone treatment for infertility.   OCP use for 15 years   She has not used HRT in the past.    FAMILY HISTORY:  We obtained a detailed, 4-generation family history.  Significant diagnoses are listed below: Family History  Problem Relation Age of Onset  . Heart disease Mother   . Cancer Sister     ovarian  . Kidney cancer Sister   . Prostate cancer Paternal Uncle   . Prostate cancer Paternal Grandfather   . Breast  cancer Cousin     4 paternal cousins with breast cancer in thier 57s  The patient has three sisters and one brother.  One sister was diagnosed with kidney cancer at age 32, and the other was diagnosed with ovarian cancer at age 57.  This sister was tested for BRCA mutations and was negative.  The patients mother died of Bombastic anemia at age 32.  There was no reported cancer history on the maternal side of the family.  The patient's father died of pneumonia at age 42.  He had four sisters and three brothers.  One brother had prostate cancer and died at age 43.  There are four paternal cousins with breast cancer in their 68s, and her paternal grandfather was diagnosed with prostate cancer and died at 8.  There is no other reported cancer history.  Patient's maternal ancestors are of [place] descent, and paternal ancestors are of [place] and [place] descent. There is no reported Ashkenazi Jewish ancestry. There is no  known consanguinity.  GENETIC COUNSELING RISK ASSESSMENT, DISCUSSION, AND SUGGESTED FOLLOW UP: We reviewed the natural history and genetic etiology of sporadic, familial and hereditary cancer syndromes.  About 5-10% of breast cancer is hereditary.  Of this, about 85% is the result of a BRCA1 or BRCA2 mutation.  We reviewed the  red flags of hereditary cancer syndromes and the dominant inheritance patterns.  If the BRCA testing is negative, we discussed that we could be testing for the wrong gene.  We discussed gene panels, and that several cancer genes that are associated with different cancers can be tested at the same time.  Because of the different types of cancer that are in the patient's family, we will consider the OvaNext panel test to test for genes that increase the risk for breast and ovarian cancer.   The patient's personal history of breast cancer and family history of breast, ovarian, kidney and prostate cancer is suggestive of the following possible diagnosis: hereditary cancer  syndrome  We discussed that identification of a hereditary cancer syndrome may help her care providers tailor the patients medical management. If a mutation indicating a hereditary cancer syndrome is detected in this case, the [employer] recommendations would include increased cancer surveillance and possible prophylactic surgery. If a mutation is detected, the patient will be referred back to the referring provider and to any additional appropriate care providers to discuss the relevant options.   If a mutation is not found in the patient, this will decrease the likelihood of a hereditary cancer syndroem as the explanation for her breast cancer. Cancer surveillance options would be discussed for the patient according to the appropriate standard National Comprehensive Cancer Network and American Cancer Society guidelines, with consideration of their personal and family history risk factors. In this case, the patient will be referred back to their care providers for discussions of management.   In order to estimate her chance of having a BRCA1 or BRCA2 mutation, we used statistical models (Penn II) and laboratory data that take into account her personal medical history, family history and ancestry.  Because each model is different, there can be a lot of variability in the risks they give.  Therefore, these numbers must be considered a rough range and not a precise risk of having a BRCA1 or BRCA2 mutation.  These models estimate that she has approximately a 11% chance of having a mutation. Based on this assessment of her family and personal history, genetic testing ia recommended.  After considering the risks, benefits, and limitations, the patient provided informed consent for  the following  testing: OvaNext panel through [employer]   Per the patient's request, we will contact her by telephone to discuss these results. A follow up genetic counseling visit will be scheduled  if indicated.  The patient was seen for a total of 60 minutes, greater than 50% of which was spent face-to-face counseling.  This plan is being carried out per Dr. Arnell Asal recommendations.  This note will also be sent to the referring provider via the electronic medical record. The patient will be supplied with a summary of this genetic counseling discussion as well as educational information on the discussed hereditary cancer syndromes following the conclusion of their visit.   Patient was discussed with Dr. Drue Second.  EPIC CC: Glenford Peers, MD Claud Kelp, MD  CC BY [place] MAIL: Jeralyn Ruths, MD Aram Beecham, MD   _______________________________________________________________________ For Office Staff:  Number of people involved in session: 3 Was an Intern/ student involved with case: not applicable

## 2012-03-29 ENCOUNTER — Encounter (INDEPENDENT_AMBULATORY_CARE_PROVIDER_SITE_OTHER): Payer: Self-pay | Admitting: General Surgery

## 2012-03-29 ENCOUNTER — Ambulatory Visit (INDEPENDENT_AMBULATORY_CARE_PROVIDER_SITE_OTHER): Payer: Medicare Other | Admitting: General Surgery

## 2012-03-29 VITALS — BP 120/68 | HR 68 | Temp 97.4°F | Resp 16 | Ht 62.5 in | Wt 153.2 lb

## 2012-03-29 DIAGNOSIS — Z853 Personal history of malignant neoplasm of breast: Secondary | ICD-10-CM

## 2012-03-29 DIAGNOSIS — D4860 Neoplasm of uncertain behavior of unspecified breast: Secondary | ICD-10-CM

## 2012-03-29 NOTE — Patient Instructions (Signed)
We have talked about the pros and cons of close surveillance versus prophylactic mastectomy. Assuming that your genetic testing is negative, we will plan to hold off on any prophylactic surgery, and just follow you closely.  Because you have atypical lobular neoplasia of the left breast, I recommend that you get a mammogram of the left breast in 6 months and see me at that time.  I have sent a message Dr. Mariel Sleet to give me a call next week for discussion of your treatment plan

## 2012-03-29 NOTE — Progress Notes (Addendum)
Patient ID: Anna Frank, female   DOB: [birth year], 66 y.o.   MRN: [id number]  Chief Complaint  Patient presents with  . Pre-op Exam    HPI Anna B Marchesi is a 66 y.o. female.  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  Recall that this patient has a significant history of cancer of the right breast. In July 1998 she underwent right partial mastectomy and axillary lymph node dissection by Dr. Francina Ames for stage T2, N2 cancer. She recurred and underwent right total mastectomy by Dr. Maple Hudson on 09/12/2005 and then a stem cell transplant along with chemotherapy and adjuvant Femara at [facility] by Dr. Kandice Hams. She has had surgical procedures  Radford Pax  as well  She's had left breast biopsy in May of  last year by Dr. 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 Yolanda Bonine showed density in the left breast at the 11:00 position. There was focal uptake on gamma imaging. Biopsy showed atypical lobular neoplasia.  He has significant comorbidities including history of stroke, past history of smoking, hypertension, non-insulin-dependent diabetes.  It turns out that her cousins who have breast cancer and a sister who had ovarian cancer were tested for BRCA, but they were negative and the patient has never been tested.  We were contemplating a prophylactic left left mastectomy. She was referred to Dr. Mariel Sleet. He has taken her off of aromasin. Marland Kitchen She was referred for genetic counseling and has undergone blood testing for genetic testing for multiple tests on September 10 and we are awaiting those results.  She states that if genetic  testing is negative she simply wants to be followed closely. She states that if genetic testing shows a deleterious  mutation that she wants a left mastectomy. ADDENDUM  (07/04/2012): genetic testing negative for deleterious mutation.  I did not examine her breasts today, but we had a 40 minute conversation with her and her husband. I made a call to Dr. Mariel Sleet office. HPI  Past Medical History  Diagnosis Date  . Blood transfusion   . Cancer   . Diabetes mellitus   . Hypertension   . Stroke   . Breast cancer     Past Surgical History  Procedure Date  . Breast surgery 1998    lumpectomy  . Breast surgery 2007    mastectomy  . Mastectomy 1998    right breast    Family History  Problem Relation Age of Onset  . Heart disease Mother   . Cancer Sister     ovarian  . Kidney cancer Sister   . Prostate cancer Paternal Uncle   . Prostate cancer Paternal Grandfather   . Breast cancer Cousin     4 paternal cousins with breast cancer in thier 70s    Social History History  Substance Use Topics  . Smoking status: Former Smoker -- 1.0 packs/day for 20 years  . Smokeless tobacco: Former Neurosurgeon    Quit date: 08/29/1996  . Alcohol Use: Yes     3 per week    No Known Allergies  Current Outpatient Prescriptions  Medication Sig Dispense Refill  . Ascorbic Acid (VITAMIN C) 1000 MG tablet Take 1,000 mg by mouth daily.      Marland Kitchen aspirin 81 MG tablet Take 81 mg by mouth daily.      Marland Kitchen b complex vitamins tablet Take  1 tablet by mouth daily.      Marland Kitchen glimepiride (AMARYL) 2 MG tablet Take 2 mg by mouth daily before breakfast.        . Liraglutide (VICTOZA [place]) Inject 6.5 mg into the skin daily.      Marland Kitchen LISINOPRIL PO Take 25 mg by mouth daily.        Marland Kitchen METFORMIN HCL PO Take 2,000 mg by mouth 2 (two) times daily.        Marland Kitchen SIMVASTATIN PO Take by mouth daily.        . vitamin E 1000 UNIT capsule Take 1,000 Units by mouth daily.        Review of Systems Review of Systems  Constitutional: Negative for fever, chills and unexpected weight change.  HENT: Negative for hearing loss, congestion, sore throat, trouble swallowing and voice change.   Eyes: Negative for  visual disturbance.  Respiratory: Negative for cough and wheezing.   Cardiovascular: Negative for chest pain, palpitations and leg swelling.  Gastrointestinal: Negative for nausea, vomiting, abdominal pain, diarrhea, constipation, blood in stool, abdominal distention and anal bleeding.  Genitourinary: Negative for hematuria, vaginal bleeding and difficulty urinating.  Musculoskeletal: Negative for arthralgias.  Skin: Negative for rash and wound.  Neurological: Negative for seizures, syncope and headaches.  Hematological: Negative for adenopathy. Does not bruise/bleed easily.  Psychiatric/Behavioral: Negative for confusion.    Blood pressure 120/68, pulse 68, temperature 97.4 F (36.3 C), temperature source Temporal, resp. rate 16, height 5' 2.5" (1.588 m), weight 153 lb 4 oz (69.514 kg).  Physical Exam Physical Exam  Constitutional: She is oriented to person, place, and time. She appears well-developed and well-nourished. No distress.  Pulmonary/Chest:       Not examined  Neurological: She is alert and oriented to person, place, and time.  Skin: She is not diaphoretic.  Psychiatric: She has a normal mood and affect. Her behavior is normal. Judgment and thought content normal.    Data Reviewed I reviewed Dr. Thornton Papas office notes and the note from Maylon Cos of genetic counseling.  Assessment    Recurrent atypical lobular neoplasia left breast, most recent focus at the 11:00 position on the left.  Past history right partial mastectomy and lymph node dissection 1998, stem cell transplant, recurrence and salvage right mastectomy 2007 as described above. No evidence of local recurrence on the right  History tobacco abuse  Past history cerebrovascular accident in 1998  Hypertension  Bothersome to the diabetes  Drawn family history of breast cancer and ovarian cancer, with family members negative for genetic mutation    Plan    We had a 45 minute conversation about all  of these issues. I think that close surveillance is a very reasonable option. I told her that since she has had multiple biopsies of the left, that there was no strong indication for further biopsy at this time. I told her this was a little bit outside of the standard of care, but because of her individual situation that close followup would be probably be equivalent in terms of outcome. She agrees  She will return to see me in 6 months after she gets a left breast mammogram at short interval  If genetic testing reveals a deleterious mutation, she states that she very much wants to have a prophylactic mastectomy on the left. I agree. ADDENDUM (07/04/2012): genetic testing negative for deleterious mutation.  I'll discuss her care with Dr. Mariel Sleet next week.       Angelia Mould. Derrell Lolling,  M.D., [facility] General and Minimally invasive Surgery Breast and Colorectal Surgery Office:   [phone] Pager:   [phone]  03/29/2012, 12:24 PM

## 2012-04-10 ENCOUNTER — Encounter (HOSPITAL_COMMUNITY): Payer: Medicare Other | Attending: Oncology | Admitting: Oncology

## 2012-04-10 VITALS — BP 109/77 | HR 97 | Temp 98.4°F | Resp 16 | Wt 155.0 lb

## 2012-04-10 DIAGNOSIS — N6089 Other benign mammary dysplasias of unspecified breast: Secondary | ICD-10-CM

## 2012-04-10 DIAGNOSIS — Z853 Personal history of malignant neoplasm of breast: Secondary | ICD-10-CM | POA: Insufficient documentation

## 2012-04-10 MED ORDER — RALOXIFENE HCL 60 MG PO TABS: 60.0000 mg | ORAL_TABLET | Freq: Every day | ORAL | Status: DC

## 2012-04-10 NOTE — Patient Instructions (Addendum)
[facility]  Discharge Instructions  RECOMMENDATIONS MADE BY THE CONSULTANT AND ANY TEST RESULTS WILL BE SENT TO YOUR REFERRING DOCTOR.   EXAM FINDINGS BY MD TODAY AND SIGNS AND SYMPTOMS TO REPORT TO CLINIC OR PRIMARY MD: Will put you on some medication for your bones.  Need to do a bone density to see how your bones are doing.  MEDICATIONS PRESCRIBED: Evista 60 mg daily Follow label directions  INSTRUCTIONS GIVEN AND DISCUSSED: Other :  Report any new lumps, bone pain or shortness of breath. Bone Density on Thursday at 4pm at [place].  You need to arrive at 3:45 pm.  No calcium the day before or the day of the study.  Don't wear anything with metal buttons or zippers below the waist.  SPECIAL INSTRUCTIONS/FOLLOW-UP: Xray Studies Needed Bone Density on Thursday  and Return to Clinic to see MD in 6 month.   I acknowledge that I have been informed and understand all the instructions given to me and received a copy. I do not have any more questions at this time, but understand that I may call the Specialty Clinic at [facility] at [phone] during business hours should I have any further questions or need assistance in obtaining follow-up care.    __________________________________________  _____________  __________ Signature of Patient or Authorized Representative            Date                   Time    __________________________________________ Nurse's Signature

## 2012-04-10 NOTE — Progress Notes (Signed)
Problem #1 ALH of the left breast with multiple biopsies within the last 2 years. Problem #2 history of invasive lobular carcinoma in 1998 with multiple positive nodes giving her T2, N2 disease status post chemotherapy followed by bone marrow transplantation followed by radiation therapy and 5 years of letrozole. She then had recurrence of the invasive lobular carcinoma in situ small areas of the breast 6 mm, and 4 mm, respectively, grade 2, estrogen receptor positive and progesterone separate negative. She was placed back on aromatase inhibitor therapy with Aromasin after a salvage mastectomy on the right on 09/13/2005. She has had a total of 11-1/2 years of aromatase inhibitor therapy.   Anna Frank is awaiting BRCA1 or BRCA2 testing results. This certainly may color our decision making process. If she is negative however the [facility] by itself does not mean she has to have a mastectomy by any means. I have discussed her case extensively with Dr. Garlon Hatchet at [facility] who also agrees. He I also talked about the length of her aromatase inhibitor therapy and the fact we need to check her bone density and then if she is BRCA1 and BRCA2 negative consider her for raloxifene therapy for 5 years with calcium and vitamin D.  I discussed his today with her and her husband in detail and she is willing to consider this. But we will await the results of the BRCA1 or BRCA2 testing. Tentatively I will see her in 6 months and we will check with Dr. Cherlyn Labella office about her last bone density and schedule 1 if it's been more than 2 years.

## 2012-04-13 ENCOUNTER — Telehealth (HOSPITAL_COMMUNITY): Payer: Self-pay

## 2012-04-13 NOTE — Telephone Encounter (Signed)
Call from patient stating "I read the side effects for the Evista and I'm not sure I want to take it.  It has heart attack and stroke listed and I've already had a stroke."  Discussed with Dellis Anes, PA and he said it should be ok for her to take it.  Patient informed and she plans to start it.  Instructed to contact us if she has any problems with the medication.  Verbalizes understanding.

## 2012-04-18 ENCOUNTER — Encounter: Payer: Self-pay | Admitting: Oncology

## 2012-04-27 ENCOUNTER — Telehealth (HOSPITAL_COMMUNITY): Payer: Self-pay

## 2012-04-27 NOTE — Telephone Encounter (Signed)
Results of bone density discussed with patient.

## 2012-05-02 ENCOUNTER — Other Ambulatory Visit (HOSPITAL_COMMUNITY): Payer: Self-pay

## 2012-05-02 ENCOUNTER — Ambulatory Visit: Admit: 2012-05-02 | Payer: Self-pay | Admitting: General Surgery

## 2012-05-02 SURGERY — SIMPLE MASTECTOMY WITH AXILLARY SENTINEL NODE BIOPSY
Anesthesia: General | Site: Breast | Laterality: Left

## 2012-05-17 ENCOUNTER — Encounter (INDEPENDENT_AMBULATORY_CARE_PROVIDER_SITE_OTHER): Payer: Medicare Other | Admitting: General Surgery

## 2012-06-12 ENCOUNTER — Encounter: Payer: Self-pay | Admitting: Oncology

## 2012-07-02 ENCOUNTER — Telehealth: Payer: Self-pay | Admitting: Genetic Counselor

## 2012-07-02 NOTE — Telephone Encounter (Signed)
Revealed negative OvaNext test results. 

## 2012-07-04 ENCOUNTER — Encounter: Payer: Self-pay | Admitting: Genetic Counselor

## 2012-09-24 ENCOUNTER — Other Ambulatory Visit: Payer: Self-pay | Admitting: Radiology

## 2012-09-24 DIAGNOSIS — R922 Inconclusive mammogram: Secondary | ICD-10-CM

## 2012-09-25 ENCOUNTER — Ambulatory Visit (INDEPENDENT_AMBULATORY_CARE_PROVIDER_SITE_OTHER): Payer: Medicare Other | Admitting: General Surgery

## 2012-09-25 ENCOUNTER — Encounter (INDEPENDENT_AMBULATORY_CARE_PROVIDER_SITE_OTHER): Payer: Self-pay | Admitting: General Surgery

## 2012-09-25 VITALS — BP 124/80 | HR 76 | Temp 97.8°F | Resp 16 | Ht 62.5 in | Wt 153.2 lb

## 2012-09-25 DIAGNOSIS — Z853 Personal history of malignant neoplasm of breast: Secondary | ICD-10-CM

## 2012-09-25 DIAGNOSIS — D4862 Neoplasm of uncertain behavior of left breast: Secondary | ICD-10-CM

## 2012-09-25 DIAGNOSIS — D4860 Neoplasm of uncertain behavior of unspecified breast: Secondary | ICD-10-CM

## 2012-09-25 NOTE — Patient Instructions (Addendum)
Your breast exam on the left and chest wall exam on the right today are normal. There is no evidence of cancer.  Your recent left breast mammogram is normal, however the breasts are dense and difficult to interpret.  You are scheduled for a breast MRI on March 17.  Be sure to make a followup appointment Dr. Mariel Sleet in the near future.  At your request, I will make an appointment to see you in 3-4 weeks to discuss the MRI findings.

## 2012-09-25 NOTE — Progress Notes (Addendum)
Patient ID: Anna Frank, female   DOB: April 09, 1946, 67 y.o.   MRN: [id number]  Chief Complaint  Patient presents with  . Breast Cancer Long Term Follow Up    reck breast    HPI Anna B Henney is a 67 y.o. female.  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  Recall that this patient has a significant history of cancer of the right breast. In July 1998 she underwent right partial mastectomy and axillary lymph node dissection by Dr. Francina Ames for stage T2, N2 cancer. She recurred and underwent right total mastectomy by Dr. Maple Hudson on 09/12/2005 and then a stem cell transplant along with chemotherapy and adjuvant Femara at [facility] by Dr. Kandice Hams. She has had surgical procedures Radford Pax as well   She's had left breast biopsy in May of last year by Dr. Michel Harrow revealing 3 separate foci of atypical lobular neoplasia and close clinical followup was recommended   Recent screening mammograms with Dr. Yolanda Bonine showed density in the left breast at the 11:00 position. There was focal uptake on gamma imaging. Biopsy showed atypical lobular neoplasia.   She has significant comorbidities including history of stroke, past history of smoking, hypertension, non-insulin-dependent diabetes.   It turns out that her cousins who have breast cancer and a sister who had ovarian cancer were tested for BRCA, but they were negative and the patient has never been tested. ADDENDUM (07/04/2012): genetic testing negative for deleterious mutation  She has seen Dr. Mariel Sleet. He he has discussed her situation with Dr. Garlon Hatchet at [facility] who agrees with [place] that there is no indication for mastectomy on the left side, unless the patient demands this. At this point in time she has held off on having a prophylactic left mastectomy but she still worries about whether she should do this or  not.  On 09/20/2012 she underwent 3-D mammogram of the left breast and left breast ultrasound. The breasts were extremely dense but no focal abnormalities were found, BIRADS Cat. 1.   Dr. Yolanda Bonine strongly recommended MRI, and the patient is scheduled for breast MRI on March 17.    HPI  Past Medical History  Diagnosis Date  . Blood transfusion   . Diabetes mellitus   . Hypertension   . Stroke   . Cancer   . Breast cancer     RIGHT BREAST    Past Surgical History  Procedure Laterality Date  . Breast surgery  1998    lumpectomy  . Breast surgery  2007    mastectomy  . Mastectomy  1998    right breast    Family History  Problem Relation Age of Onset  . Heart disease Mother   . Cancer Sister     ovarian  . Kidney cancer Sister   . Prostate cancer Paternal Uncle   . Prostate cancer Paternal Grandfather   . Breast cancer Cousin     4 paternal cousins with breast cancer in thier 45s    Social History History  Substance Use Topics  . Smoking status: Former Smoker -- 1.00 packs/day for 20 years  . Smokeless tobacco: Former Neurosurgeon    Quit date: 08/29/1996  . Alcohol Use: Yes     Comment: 3 per week    No Known Allergies  Current Outpatient Prescriptions  Medication Sig Dispense Refill  . Ascorbic Acid (VITAMIN C) 1000 MG tablet  Take 1,000 mg by mouth daily.      Marland Kitchen aspirin 81 MG tablet Take 81 mg by mouth daily.      Marland Kitchen b complex vitamins tablet Take 1 tablet by mouth daily.      . fish oil-omega-3 fatty acids 1000 MG capsule Take 1 g by mouth daily.      . Liraglutide (VICTOZA [place]) Inject 6.5 mg into the skin daily.      Marland Kitchen LISINOPRIL PO Take 25 mg by mouth daily.        Marland Kitchen METFORMIN HCL PO Take 2,000 mg by mouth 2 (two) times daily.        . pioglitazone (ACTOS) 15 MG tablet Take 15 mg by mouth daily. Pt states she is trying to come off by not taking it every day      . raloxifene (EVISTA) 60 MG tablet Take 1 tablet (60 mg total) by mouth daily.  30 tablet  12  .  SIMVASTATIN PO Take 20 mg by mouth daily.       . vitamin E 1000 UNIT capsule Take 1,000 Units by mouth daily.       No current facility-administered medications for this visit.    Review of Systems Review of Systems  Constitutional: Negative for fever, chills and unexpected weight change.  HENT: Negative for hearing loss, congestion, sore throat, trouble swallowing and voice change.   Eyes: Negative for visual disturbance.  Respiratory: Negative for cough and wheezing.   Cardiovascular: Negative for chest pain, palpitations and leg swelling.  Gastrointestinal: Negative for nausea, vomiting, abdominal pain, diarrhea, constipation, blood in stool, abdominal distention and anal bleeding.  Genitourinary: Negative for hematuria, vaginal bleeding and difficulty urinating.  Musculoskeletal: Negative for arthralgias.  Skin: Negative for rash and wound.  Neurological: Negative for seizures, syncope and headaches.  Hematological: Negative for adenopathy. Does not bruise/bleed easily.  Psychiatric/Behavioral: Negative for confusion. The patient is nervous/anxious.     Blood pressure 124/80, pulse 76, temperature 97.8 F (36.6 C), temperature source Oral, resp. rate 16, height 5' 2.5" (1.588 m), weight 153 lb 3.2 oz (69.491 kg).  Physical Exam Physical Exam  Constitutional: She is oriented to person, place, and time. She appears well-developed and well-nourished. No distress.  Eyes: Conjunctivae and EOM are normal. Pupils are equal, round, and reactive to light. Left eye exhibits no discharge. No scleral icterus.  Neck: Neck supple. No JVD present. No tracheal deviation present. No thyromegaly present.  Cardiovascular: Normal rate, regular rhythm, normal heart sounds and intact distal pulses.   No murmur heard. Pulmonary/Chest: Effort normal and breath sounds normal. No respiratory distress. She has no wheezes. She has no rales. She exhibits no tenderness.  Right mastectomy scar well healed. No  palpable nodules ulcerations or adenopathy. Left breast medium size, soft. No palpable mass. Some healed scars present. No axillary adenopathy. Areolar normal.  Musculoskeletal: She exhibits no edema and no tenderness.  Lymphadenopathy:    She has no cervical adenopathy.  Neurological: She is alert and oriented to person, place, and time.  Skin: Skin is warm. No rash noted. She is not diaphoretic. No erythema. No pallor.  Psychiatric: She has a normal mood and affect. Her behavior is normal. Judgment and thought content normal.    Data Reviewed Recent mammogram. My records. Dr. Thornton Papas old records.  Assessment    Recurrent atypical lobular neoplasia left breast, most recent focus at the 11:00 position on the left.  Recent mammograms and ultrasound showed a focal abnormality,  although breast or high density.  Past history right partial mastectomy and lymph node dissection 1998, stem cell transplant, recurrence and salvage right mastectomy 2007 as described above. No evidence of local recurrence on the right   Recent genetic testing is negative for deleterious mutation  History tobacco abuse  Past history cerebrovascular accident in 1998  Hypertension  Borderline diabetes   family history of breast cancer and ovarian cancer, with family members negative for genetic mutation     Plan    We had a long discussion. She was to continue close followup with Dr. Mariel Sleet, me, and her radiologist.  MRI is scheduled for March 17  She was encouraged to call Dr. Thornton Papas office to confirm her next appointment  At her request, I will see her back in 3-4 weeks to discuss the MRI, regardless of the findings. She is still  somewhat conflicted about whether to have a prophylactic mastectomy. I think we need to continue to talk this over.   Addendum 10/09/2012: Dr. Mariel Sleet called me to discuss MRI findings which show a 9 mm density in the left breast. MRI guided biopsy was recommended  but he states she does not want to do that. She is going to see me on April 3 and we will discuss left breast biopsy with needle loc. at that time. Dr. Mariel Sleet states she may have other questions about the extent of surgery, prophylactic mastectomy, and so forth. It appears that we need to get a tissue diagnosis first. We will spend some time talking this over.       INGRAM,HAYWOOD M 09/25/2012, 12:42 PM

## 2012-09-26 ENCOUNTER — Encounter (INDEPENDENT_AMBULATORY_CARE_PROVIDER_SITE_OTHER): Payer: Self-pay

## 2012-09-27 ENCOUNTER — Encounter (INDEPENDENT_AMBULATORY_CARE_PROVIDER_SITE_OTHER): Payer: Self-pay

## 2012-10-01 ENCOUNTER — Ambulatory Visit
Admission: RE | Admit: 2012-10-01 | Discharge: 2012-10-01 | Disposition: A | Discharge status: Home / Self Care | Payer: Medicare Other | Source: Ambulatory Visit | Attending: Radiology | Admitting: Radiology

## 2012-10-01 DIAGNOSIS — R922 Inconclusive mammogram: Secondary | ICD-10-CM

## 2012-10-01 MED ORDER — GADOBENATE DIMEGLUMINE 529 MG/ML IV SOLN
13.0000 mL | Freq: Once | INTRAVENOUS | Status: AC | PRN
  Administered 2012-10-01: 13 mL via INTRAVENOUS

## 2012-10-05 ENCOUNTER — Telehealth (HOSPITAL_COMMUNITY): Payer: Self-pay

## 2012-10-05 NOTE — Telephone Encounter (Signed)
Call from patient.  States " Dr. Yolanda Bonine called me to let me know that there was an abnormality on my MRI of my breast that was done on 4/17.  Before I have anything done, I want to talk with Dr.Neistrom.  I may want to go ahead and have a Mastectomy instead of a biopsy but I want to talk with him first.  I have an appointment with him on Tuesday but if the weather is not good I may not be able to make it, so I wanted to give him a heads up so maybe he can look at the MRI."

## 2012-10-09 ENCOUNTER — Encounter (HOSPITAL_COMMUNITY): Payer: Self-pay | Admitting: Oncology

## 2012-10-09 ENCOUNTER — Encounter (HOSPITAL_COMMUNITY): Payer: Medicare Other | Attending: Oncology | Admitting: Oncology

## 2012-10-09 VITALS — BP 114/70 | HR 88 | Temp 98.0°F | Resp 16 | Wt 151.6 lb

## 2012-10-09 DIAGNOSIS — N6089 Other benign mammary dysplasias of unspecified breast: Secondary | ICD-10-CM

## 2012-10-09 DIAGNOSIS — Z853 Personal history of malignant neoplasm of breast: Secondary | ICD-10-CM

## 2012-10-09 NOTE — Progress Notes (Signed)
Problem #1 ALH of the left breast with multiple biopsies were the last 2-3 years. Once again she has an atypical 9 mm area on the left breast by MRI. Problem #2 invasive lobular carcinoma 1998 of the right breast old full positive nodes giving her T2, N2 disease status post chemotherapy followed by bone marrow transplantation followed by radiation therapy and 5 years of letrozole by Dr. Dossie Der at [facility]. She then had a recurrence of invasive lobular carcinoma with in situ disease, 6 mm, and 4 mm, respectively, grade 2, estrogen receptor positive but progesterone receptor negative. She went on to have a mastectomy and was placed back on aromatase inhibitor namely Aromasin after salvage mastectomy on 09/13/2005. Altogether she has had a total of 11-1/2 years of aromatase inhibitor therapy. She is BRCA1 or BRCA2 negative. That has now been confirmed.  She is now dealing with an atypical area in the left breast once again. MRI was suggested by Dr. Yolanda Bonine and was performed. Once again it suggests a 9 mm area that might be malignant.  The patient does not want an MRI guided biopsy since her last experience with that at [facility] was very very painful.  Her vital signs remained stable. She states her sugar is in good but not perfect control. She states her cholesterols are well controlled.  She has no lymphadenopathy in the cervical, supraclavicular, infraclavicular, axillary or inguinal areas. Her abdomen is soft and nontender without hepatosplenomegaly. Bowel sounds are normal. Lungs are clear to auscultation and percussion. Right chest wall is clear. Her left breast is slightly lumpy bumpy but without distinct mass whatsoever. Heart shows a regular rhythm and rate. Bowel sounds are normal. She has no arm or leg edema. She is alert and oriented.  I've discussed her case with Dr. Derrell Lolling who is happy to do needle localization excisional biopsy. It was suggested that she needs a  sentinel lymph node by her radiologist but he and I do not feel that is indicated presently unless she has an absolute diagnosis of cancer. Therefore Dr. Derrell Lolling will see her on April 3 as scheduled to go over things with her.  What I have suggested to her should she ever decide to have a mastectomy let alone need a mastectomy than an opinion with a plastic surgeon such as Dr. Kelly Splinter might be very helpful to the patient. I will tentatively see the patient in 6 months sooner if I need to.

## 2012-10-09 NOTE — Patient Instructions (Addendum)
[facility] Discharge Instructions  RECOMMENDATIONS MADE BY THE CONSULTANT AND ANY TEST RESULTS WILL BE SENT TO YOUR REFERRING PHYSICIAN.  EXAM FINDINGS BY THE PHYSICIAN TODAY AND SIGNS OR SYMPTOMS TO REPORT TO CLINIC OR PRIMARY PHYSICIAN: Exam and discussion by MD.  Dr. Mariel Sleet will talk with Dr. Derrell Lolling.  Thinks that biopsy of the suspicious area is warranted.   MEDICATIONS PRESCRIBED:  none  INSTRUCTIONS GIVEN AND DISCUSSED: Report any new lumps, bone pain or shortness of breath.  SPECIAL INSTRUCTIONS/FOLLOW-UP: Return in 6 months to see MD.  Thank you for choosing [facility] to provide your oncology and hematology care.  To afford each patient quality time with our providers, please arrive at least 15 minutes before your scheduled appointment time.  With your help, our goal is to use those 15 minutes to complete the necessary work-up to ensure our physicians have the information they need to help with your evaluation and healthcare recommendations.    Effective January 1st, 2014, we ask that you re-schedule your appointment with our physicians should you arrive 10 or more minutes late for your appointment.  We strive to give you quality time with our providers, and arriving late affects you and other patients whose appointments are after yours.    Again, thank you for choosing [facility].  Our hope is that these requests will decrease the amount of time that you wait before being seen by our physicians.       _____________________________________________________________  Should you have questions after your visit to [facility], please contact our office at [phone] between the hours of 8:30 a.m. and 5:00 p.m.  Voicemails left after 4:30 p.m. will not be returned until the following business day.  For prescription refill requests, have your pharmacy contact our office with your prescription refill request.

## 2012-10-18 ENCOUNTER — Ambulatory Visit (INDEPENDENT_AMBULATORY_CARE_PROVIDER_SITE_OTHER): Payer: Medicare Other | Admitting: General Surgery

## 2012-10-18 ENCOUNTER — Other Ambulatory Visit (INDEPENDENT_AMBULATORY_CARE_PROVIDER_SITE_OTHER): Payer: Self-pay

## 2012-10-18 ENCOUNTER — Encounter (INDEPENDENT_AMBULATORY_CARE_PROVIDER_SITE_OTHER): Payer: Self-pay | Admitting: General Surgery

## 2012-10-18 VITALS — BP 120/78 | HR 78 | Temp 97.0°F | Resp 20 | Ht 63.0 in | Wt 153.0 lb

## 2012-10-18 DIAGNOSIS — R9389 Abnormal findings on diagnostic imaging of other specified body structures: Secondary | ICD-10-CM

## 2012-10-18 DIAGNOSIS — Z853 Personal history of malignant neoplasm of breast: Secondary | ICD-10-CM

## 2012-10-18 DIAGNOSIS — R928 Other abnormal and inconclusive findings on diagnostic imaging of breast: Secondary | ICD-10-CM

## 2012-10-18 NOTE — Patient Instructions (Signed)
Your MRI shows a small (9mm) solitary area of abnormal, irregular enhancement in the left breast centrally. This area should be biopsied and we need to  get a tissue diagnosis.  We talked about several different options for taking care of this, and at the end of the conversation we have decided to refer you back to the breast center of [facility]  for an MRI guided biopsy of the left breast mass.  You will be given an appointment to return to see Dr. Derrell Lolling to discuss this after the biopsy tests are completed. I think this was a good decision.

## 2012-10-18 NOTE — Progress Notes (Signed)
Patient ID: Anna Frank, female   DOB: 1946/04/02, 67 y.o.   MRN: [id number]  Chief Complaint  Patient presents with  . Pre-op Exam    HPI Anna Frank is a 67 y.o. female.  She returns to see me to discuss her recent MRI, which shows a 9 mm, solitary area of irregular enhancement centrally in the left breast centrally, slightly medially. Her husband is with her today.  She was advised by Anselmo Pickler to have MRI guided biopsy of this area. She had a very bad experience at [facility] with an MRI guided biopsy  and wanted to talk to me about whether I should do an excisional biopsy or just simply go ahead with a mastectomy. We talked a long time about this. We talked about every option we can think of including wire localized excisional biopsy, MRI guided core biopsy, prophylactic mastectomy with or without reconstruction. We talked about SLN biopsy in known cancer and in high-risk disease. I advised against doing nothing because of her past history. She basically agrees with that. After a one-hour discussion she decided to go back and have a MRI guided core biopsy to see if we could clear the tissue diagnosis before making any surgical decisions.   Recall that this patient has a significant history of cancer of the right breast. In July 1998 she underwent right partial mastectomy and axillary lymph node dissection by Dr. Francina Ames for stage T2, N2 cancer. She recurred and underwent right total mastectomy by Dr. Maple Hudson on 09/12/2005 and then a stem cell transplant along with chemotherapy and adjuvant Femara at [facility] by Dr. Kandice Hams. She has had surgical procedures [facility] as well  She's had left breast biopsy in May of last year by Dr. 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 Yolanda Bonine showed density in the left breast at the 11:00 position. There was focal uptake on gamma  imaging. Biopsy showed atypical lobular neoplasia.  She has significant comorbidities including history of stroke, past history of smoking, hypertension, non-insulin-dependent diabetes.  It turns out that her cousins who have breast cancer and a sister who had ovarian cancer were tested for BRCA, but they were negative and the patient has never been tested. ADDENDUM (07/04/2012): genetic testing negative for deleterious mutation   She has seen Dr. Mariel Sleet. He he has discussed her situation with Dr. Garlon Hatchet at [facility] who agrees with [place] that there is no indication for mastectomy on the left side, unless the patient demands this. At this point in time she has held off on having a prophylactic left mastectomy but she still worries about whether she should do this or not.   On 09/20/2012 she underwent 3-D mammogram of the left breast and left breast ultrasound. The breasts were extremely dense but no focal abnormalities were found, BIRADS Cat. 1. Dr. Yolanda Bonine strongly recommended MRI, Which led to the MRI findings discussed above.   HPI  Past Medical History  Diagnosis Date  . Blood transfusion   . Diabetes mellitus   . Hypertension   . Stroke   . Cancer   . Breast cancer     RIGHT BREAST    Past Surgical History  Procedure Laterality Date  . Breast surgery  1998    lumpectomy  . Breast surgery  2007    mastectomy  . Mastectomy  1998    right breast    Family  History  Problem Relation Age of Onset  . Heart disease Mother   . Cancer Sister     ovarian  . Kidney cancer Sister   . Prostate cancer Paternal Uncle   . Prostate cancer Paternal Grandfather   . Breast cancer Cousin     4 paternal cousins with breast cancer in thier 3s    Social History History  Substance Use Topics  . Smoking status: Former Smoker -- 1.00 packs/day for 20 years  . Smokeless tobacco: Former Neurosurgeon    Quit date: 08/29/1996  . Alcohol Use: Yes     Comment: 3 per week    No Known Allergies  Current  Outpatient Prescriptions  Medication Sig Dispense Refill  . Ascorbic Acid (VITAMIN C) 1000 MG tablet Take 1,000 mg by mouth daily.      Marland Kitchen aspirin 81 MG tablet Take 81 mg by mouth daily.      Marland Kitchen b complex vitamins tablet Take 1 tablet by mouth daily.      . fish oil-omega-3 fatty acids 1000 MG capsule Take 1 g by mouth daily.      . fluticasone (FLONASE) 50 MCG/ACT nasal spray Place 2 sprays into the nose as needed for rhinitis.      . Liraglutide (VICTOZA [place]) Inject 6.5 mg into the skin daily.      Marland Kitchen LISINOPRIL PO Take 25 mg by mouth daily.        Marland Kitchen METFORMIN HCL PO Take 2,000 mg by mouth 2 (two) times daily.        . pioglitazone (ACTOS) 15 MG tablet Take 15 mg by mouth daily. Pt states she is trying to come off by not taking it every day      . raloxifene (EVISTA) 60 MG tablet Take 1 tablet (60 mg total) by mouth daily.  30 tablet  12  . SIMVASTATIN PO Take 20 mg by mouth daily.       . vitamin E 1000 UNIT capsule Take 1,000 Units by mouth daily.       No current facility-administered medications for this visit.    Review of Systems Review of Systems  Constitutional: Negative for fever, chills and unexpected weight change.  HENT: Negative for hearing loss, congestion, sore throat, trouble swallowing and voice change.   Eyes: Negative for visual disturbance.  Respiratory: Negative for cough and wheezing.   Cardiovascular: Negative for chest pain, palpitations and leg swelling.  Gastrointestinal: Negative for nausea, vomiting, abdominal pain, diarrhea, constipation, blood in stool, abdominal distention and anal bleeding.  Genitourinary: Negative for hematuria, vaginal bleeding and difficulty urinating.  Musculoskeletal: Negative for arthralgias.  Skin: Negative for rash and wound.  Neurological: Negative for seizures, syncope and headaches.  Hematological: Negative for adenopathy. Does not bruise/bleed easily.  Psychiatric/Behavioral: Negative for confusion. The patient is  nervous/anxious.     Blood pressure 120/78, pulse 78, temperature 97 F (36.1 C), temperature source Oral, resp. rate 20, height 5\' 3"  (1.6 m), weight 153 lb (69.4 kg).  Physical Exam Physical Exam  Constitutional: She is oriented to person, place, and time. She appears well-developed and well-nourished. No distress.  HENT:  Head: Normocephalic and atraumatic.  Nose: Nose normal.  Mouth/Throat: No oropharyngeal exudate.  Eyes: Conjunctivae and EOM are normal. Pupils are equal, round, and reactive to light. Left eye exhibits no discharge. No scleral icterus.  Neck: Neck supple. No JVD present. No tracheal deviation present. No thyromegaly present.  Cardiovascular: Normal rate, regular rhythm, normal heart sounds and  intact distal pulses.   No murmur heard. Pulmonary/Chest: Effort normal and breath sounds normal. No respiratory distress. She has no wheezes. She has no rales. She exhibits no tenderness.  Right mastectomy scar well healed. No palpable nodules ulcerations or adenopathy. Left breast medium size. Soft. Lumpy. They area of thickening above areolar margin at the 11:30 position. Some healed scars present. No axillary adenopathy.  Abdominal: Soft. Bowel sounds are normal. She exhibits no distension and no mass. There is no tenderness. There is no rebound and no guarding.  Musculoskeletal: She exhibits no edema and no tenderness.  Lymphadenopathy:    She has no cervical adenopathy.  Neurological: She is alert and oriented to person, place, and time. She exhibits normal muscle tone. Coordination normal.  Skin: Skin is warm. No rash noted. She is not diaphoretic. No erythema. No pallor.  Psychiatric: She has a normal mood and affect. Her behavior is normal. Judgment and thought content normal.    Data Reviewed MRI reviewed. Phone conversation with Dr. Si Gaul at [facility]. Phone conversation with Dr. Mariel Sleet. Extensive old records.  Assessment    Abnormal MRI left breast, 9 mm density  central and medial, irregular. Tissue diagnosis strongly advised.  Atypical lobular neoplasia left breast, history multiple biopsies at [facility].  Past history right partial mastectomy and lymph node dissection 1998, stem cell transplant, recurrence, and salvage right mastectomy 2007 as described above. Those of local recurrence on the right.  Recent genetic testing negative for deleterious mutation  History tobacco abuse Past history cerebrovascular accident 1998 next hypertension Borderline diabetes Family history breast cancer and ovarian cancer, with family members negative for genetic mutation.     Plan    The patient will be scheduled for MRI guided biopsy of the left breast mass. The patient will return to see me after that test is performed. We will continue our conversation depending on the biopsy findings and the patient's desires.        Angelia Mould. Derrell Lolling, M.D., [facility] General and Minimally invasive Surgery Breast and Colorectal Surgery Office:   [phone] Pager:   [phone]  10/18/2012, 12:39 PM

## 2012-10-19 ENCOUNTER — Telehealth (INDEPENDENT_AMBULATORY_CARE_PROVIDER_SITE_OTHER): Payer: Self-pay

## 2012-10-19 NOTE — Telephone Encounter (Signed)
I called and scheduled the pt to come in and discuss results on 4/14 at 11am

## 2012-10-19 NOTE — Telephone Encounter (Signed)
Message copied by Ivory Broad on Fri Oct 19, 2012  2:17 PM ------      Message from: Osborne Oman      Created: Fri Oct 19, 2012  9:42 AM       MR bx sched for Tues 10/23/12 @7 :15am.  Pt aware.      Thanks.      Olegario Messier ------

## 2012-10-23 ENCOUNTER — Ambulatory Visit
Admission: RE | Admit: 2012-10-23 | Discharge: 2012-10-23 | Disposition: A | Discharge status: Home / Self Care | Payer: Medicare Other | Source: Ambulatory Visit | Attending: General Surgery | Admitting: General Surgery

## 2012-10-23 DIAGNOSIS — R9389 Abnormal findings on diagnostic imaging of other specified body structures: Secondary | ICD-10-CM

## 2012-10-23 DIAGNOSIS — Z853 Personal history of malignant neoplasm of breast: Secondary | ICD-10-CM

## 2012-10-23 MED ORDER — GADOBENATE DIMEGLUMINE 529 MG/ML IV SOLN
13.0000 mL | Freq: Once | INTRAVENOUS | Status: AC | PRN
  Administered 2012-10-23: 13 mL via INTRAVENOUS

## 2012-10-24 ENCOUNTER — Telehealth (HOSPITAL_COMMUNITY): Payer: Self-pay

## 2012-10-24 NOTE — Telephone Encounter (Signed)
Call from [place].  Stated " I had the biopsy and they called me today and said it was a pre-cancerous lesion and recommended that I have lumpectomy.  I would like to talk with Dr. Mariel Sleet to find out what he thinks before I do anything else.  He can reach me at [phone]".

## 2012-10-25 ENCOUNTER — Encounter (INDEPENDENT_AMBULATORY_CARE_PROVIDER_SITE_OTHER): Payer: Self-pay

## 2012-10-26 ENCOUNTER — Telehealth (HOSPITAL_COMMUNITY): Payer: Self-pay

## 2012-10-26 NOTE — Telephone Encounter (Signed)
Patient notified regarding appointment with Dr. Wayland Denis on 11/20/12 @ 9:30am - [address],[place].

## 2012-10-29 ENCOUNTER — Encounter (INDEPENDENT_AMBULATORY_CARE_PROVIDER_SITE_OTHER): Payer: Self-pay | Admitting: General Surgery

## 2012-10-29 ENCOUNTER — Ambulatory Visit (INDEPENDENT_AMBULATORY_CARE_PROVIDER_SITE_OTHER): Payer: Medicare Other | Admitting: General Surgery

## 2012-10-29 VITALS — BP 136/68 | HR 84 | Temp 97.1°F | Resp 16 | Ht 63.0 in | Wt 150.0 lb

## 2012-10-29 DIAGNOSIS — D4862 Neoplasm of uncertain behavior of left breast: Secondary | ICD-10-CM

## 2012-10-29 DIAGNOSIS — R928 Other abnormal and inconclusive findings on diagnostic imaging of breast: Secondary | ICD-10-CM

## 2012-10-29 DIAGNOSIS — D4860 Neoplasm of uncertain behavior of unspecified breast: Secondary | ICD-10-CM

## 2012-10-29 NOTE — Progress Notes (Signed)
Patient ID: [place] B Kolk, female   DOB: 10-Aug-1945, 67 y.o.   MRN: [id number] History: Ms. Walthour returns to see me to discuss her recent MRI, recent MRI guided biopsy, and decision-making. Recent breast MRI demonstrated a small density with enhancement, 9 mm, left breast 9:00 position. MRI guided biopsy reveals atypical lobular hyperplasia. This is felt to be concordant. This is similar histology for her previous lumpectomy by Dr. Theressa Stamps at [facility]. We spent a long time talking about these issues. We specifically talked about her history of atypical neoplasia. We talked about how that was managed which was appropriate. We talked about her current image guided biopsy and the appropriate way to manage that, which certainly could be managed by another lumpectomy. She also wanted to talk alength about her long-term risk. She says she is leaning toward mastectomy. She has discussed this with Dr. Mariel Sleet. She has been referred to Dr. Kelly Splinter and is going to see her on May 6. We had about a 30 minute talk about these issues I  tried to put in PERSPECTIVE with a past history of cancer on the right and all that  she went through with that.  Exam: Alert. Oriented. Appropriate. Right mastectomy scar well healed. No palpable nodules. Right axilla normal. Left breast medium-sized. Soft and lumpy. Vague area of thickening above areolar margin at 11:30 position. Scars healed. No axillary adenopathy.  Assessment abnormal MRI left breast, 9 mm density 9:00 position, tissue diagnosis confirms atypical hyperplasia, lobular neoplasia  Past history Atypical lobular neoplasia left breast, history multiple lumpectomies a  Past history right partial mastectomy and lymph node dissection 1998, stem cell transplant, recurrence, and salvage right masectomy 2007 no evidence of local recurrence  Genetic testing negative for deleterious mutation  History tobacco abuse Past history CVA 1998 Hypertension  diabetes Family  history breast cancer and a very cancer with family members negative genetic mutation  Plan the patient will see Dr. Kelly Splinter on May 6 to discuss options for reconstruction if she chooses to have a left mastectomy Return to see me in 5 weeks. Both she and I felt we would need to have another conversation and a decision will need to be made about how to manage the atypical neoplasia in her left breast, 9:00 position. She knows that we can do a lumpectomy or go ahead with mastectomy depending on her feelings about risk reduction.   Angelia Mould. Derrell Lolling, M.D., [facility] General and Minimally invasive Surgery Breast and Colorectal Surgery Office:   [phone] Pager:   [phone]

## 2012-10-29 NOTE — Patient Instructions (Signed)
Your recent MRI and MRI guided biopsy of the abnormal area in the left breast at the 9 o' clock position reveals a 9 mm area of atypical hyperplasia, or lobular neoplasia.  The standard of care at this point would be to completely excise this area to be sure there is not an occult carcinoma present.  Since you are strongly considering a prophylactic mastectomy, we will wait until you have seen your plastic surgeon and have you return to see Dr. Derrell Lolling in 5 weeks for a final discussion and decision making.

## 2012-10-31 ENCOUNTER — Encounter: Payer: Self-pay | Admitting: Oncology

## 2012-12-04 ENCOUNTER — Encounter (INDEPENDENT_AMBULATORY_CARE_PROVIDER_SITE_OTHER): Payer: Self-pay | Admitting: General Surgery

## 2012-12-04 ENCOUNTER — Ambulatory Visit (INDEPENDENT_AMBULATORY_CARE_PROVIDER_SITE_OTHER): Payer: Medicare Other | Admitting: General Surgery

## 2012-12-04 VITALS — BP 124/78 | HR 87 | Temp 96.1°F | Ht 62.5 in | Wt 152.8 lb

## 2012-12-04 DIAGNOSIS — R928 Other abnormal and inconclusive findings on diagnostic imaging of breast: Secondary | ICD-10-CM

## 2012-12-04 NOTE — Patient Instructions (Signed)
We have had a lengthy discussion today about management of your left breast problems.   You have stated that you would like to proceed with left total mastectomy without reconstruction. We will schedule that surgery for you in the near future when it is convenient.    Mastectomy, With or Without Reconstruction Mastectomy (removal of the breast) is a procedure most commonly used to treat cancer (tumor) of the breast. Different procedures are available for treatment. This depends on the stage of the tumor (abnormal growths). Discuss this with your caregiver, surgeon (a specialist for performing operations such as this), or oncologist (someone specialized in the treatment of cancer). With proper information, you can decide which treatment is best for you. Although the sound of the word cancer is frightening to all of [place], the new treatments and medications can be a source of reassurance and comfort. If there are things you are worried about, discuss them with your caregiver. He or she can help comfort you and your family. Some of the different procedures for treating breast cancer are:  Radical (extensive) mastectomy. This is an operation used to remove the entire breast, the muscles under the breast, and all of the glands (lymph nodes) under the arm. With all of the new treatments available for cancer of the breast, this procedure has become less common.  Modified radical mastectomy. This is a similar operation to the radical mastectomy described above. In the modified radical mastectomy, the muscles of the chest wall are not removed unless one of the lessor muscles is removed. One of the lessor muscles may be removed to allow better removal of the lymph nodes. The axillary lymph nodes are also removed. Rarely, during an axillary node dissection nerves to this area are damaged. Radiation therapy is then often used to the area following this surgery.  A total mastectomy also known as a complete or simple  mastectomy. It involves removal of only the breast. The lymph nodes and the muscles are left in place.  In a lumpectomy, the lump is removed from the breast. This is the simplest form of surgical treatment. A sentinel lymph node biopsy may also be done. Additional treatment may be required. RISKS AND COMPLICATIONS The main problems that follow removal of the breast include:  Infection (germs start growing in the wound). This can usually be treated with antibiotics (medications that kill germs).  Lymphedema. This means the arm on the side of the breast that was operated on swells because the lymph (tissue fluid) cannot follow the main channels back into the body. This only occurs when the lymph nodes have had to be removed under the arm.  There may be some areas of numbness to the upper arm and around the incision (cut by the surgeon) in the breast. This happens because of the cutting of or damage to some of the nerves in the area. This is most often unavoidable.  There may be difficultymoving the arm in a full range of motion (moving in all directions) following surgery. This usually improves with time following use and exercise.  Recurrence of breast cancer may happen with the very best of surgery and follow up treatment. Sometimes small cancer cells that cannot be seen with the naked eye have already spread at the time of surgery. When this happens other treatment is available. This treatment may be radiation, medications or a combination of both. RECONSTRUCTION Reconstruction of the breast may be done immediately if there is not going to be post-operative radiation.  This surgery is done for cosmetic (improve appearance) purposes to improve the physical appearance after the operation. This may be done in two ways:  It can be done using a saline filled prosthetic (an artificial breast which is filled with salt water). Silicone breast implants are now re-approved by the FDA and are being commonly  used.  Reconstruction can be done using the body's own muscle/fat/skin. Your caregiver will discuss your options with you. Depending upon your needs or choice, together you will be able to determine which procedure is best for you. Document Released: 03/29/2001 Document Revised: 09/26/2011 Document Reviewed: 11/20/2007 [facility] Patient Information 2013 [place], [place].

## 2012-12-04 NOTE — Progress Notes (Signed)
Patient ID: Anna Frank, female   DOB: 01-24-1946, 67 y.o.   MRN: [id number]  Chief Complaint  Patient presents with  . Follow-up    reck and discuss sx    HPI Anna Frank is a 67 y.o. female.  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  She has seen Dr. Kelly Splinter and has discussed reconstructive options. She has decided that she does not want reconstruction but that she does want to go ahead and have a left total mastectomy sometime this summer.   Recall that this patient has a significant history of cancer of the right breast. In July 1998 she underwent right partial mastectomy and axillary lymph node dissection by Dr. Francina Ames for stage T2, N2 cancer. She recurred and underwent right total mastectomy by Dr. Maple Hudson on 09/12/2005 and then a stem cell transplant along with chemotherapy and adjuvant Femara at [facility] by Dr. Kandice Hams. She has had surgical procedures Radford Pax as well  She's had left breast biopsy in May of last year by Dr. 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 Yolanda Bonine showed density in the left breast at the 11:00 position. There was focal uptake on gamma imaging. Biopsy showed atypical lobular neoplasia.   More recently an MRI guided biopsy revealed atypical lobular hyperplasia. [place]. This is similar histology to the previous lumpectomy by Dr. Greggory Stallion a date. She has discussed management of her high risk left breast status post Dr. Mariel Sleet. She's discussed this at length and is now ready to go ahead with prophylactic mastectomy. She is aware that there is a small but definite cancer may be a occult cancer in the left breast.  She has significant comorbidities including history of stroke, past history of smoking, hypertension, non-insulin-dependent diabetes.  It turns out that her  cousins who have breast cancer and a sister who had ovarian cancer were tested for BRCA, but they were negative.  The patient had genetic testing which was negative for deleterious mutation   Today we had a 35-45 minute talk.   She would like the mastectomy incision to be low medially and high laterally I told her that was reasonable.  HPI  Past Medical History  Diagnosis Date  . Blood transfusion   . Diabetes mellitus   . Hypertension   . Stroke   . Cancer   . Breast cancer     RIGHT BREAST    Past Surgical History  Procedure Laterality Date  . Breast surgery  1998    lumpectomy  . Breast surgery  2007    mastectomy  . Mastectomy  1998    right breast    Family History  Problem Relation Age of Onset  . Heart disease Mother   . Cancer Sister     ovarian  . Kidney cancer Sister   . Prostate cancer Paternal Uncle   . Prostate cancer Paternal Grandfather   . Breast cancer Cousin     4 paternal cousins with breast cancer in thier 28s    Social History History  Substance Use Topics  . Smoking status: Former Smoker -- 1.00 packs/day for 20 years  . Smokeless tobacco: Former Neurosurgeon    Quit date: 08/29/1996  . Alcohol Use: Yes     Comment: 3 per week    No Known Allergies  Current Outpatient Prescriptions  Medication Sig Dispense Refill  .  Ascorbic Acid (VITAMIN C) 1000 MG tablet Take 1,000 mg by mouth daily.      Marland Kitchen aspirin 81 MG tablet Take 81 mg by mouth daily.      Marland Kitchen b complex vitamins tablet Take 1 tablet by mouth daily.      . fish oil-omega-3 fatty acids 1000 MG capsule Take 1 g by mouth daily.      . fluticasone (FLONASE) 50 MCG/ACT nasal spray Place 2 sprays into the nose as needed for rhinitis.      . Liraglutide (VICTOZA [place]) Inject 6.5 mg into the skin daily.      Marland Kitchen LISINOPRIL PO Take 25 mg by mouth daily.        Marland Kitchen METFORMIN HCL PO Take 2,000 mg by mouth 2 (two) times daily.        . pioglitazone (ACTOS) 15 MG tablet Take 15 mg by mouth daily. Pt states  she is trying to come off by not taking it every day      . raloxifene (EVISTA) 60 MG tablet Take 1 tablet (60 mg total) by mouth daily.  30 tablet  12  . SIMVASTATIN PO Take 20 mg by mouth daily.       . vitamin E 1000 UNIT capsule Take 1,000 Units by mouth daily.       No current facility-administered medications for this visit.    Review of Systems Review of Systems  Constitutional: Negative for fever, chills and unexpected weight change.  HENT: Negative for hearing loss, congestion, sore throat, trouble swallowing and voice change.   Eyes: Negative for visual disturbance.  Respiratory: Negative for cough and wheezing.   Cardiovascular: Negative for chest pain, palpitations and leg swelling.  Gastrointestinal: Negative for nausea, vomiting, abdominal pain, diarrhea, constipation, blood in stool, abdominal distention and anal bleeding.  Genitourinary: Negative for hematuria, vaginal bleeding and difficulty urinating.  Musculoskeletal: Negative for arthralgias.  Skin: Negative for rash and wound.  Neurological: Negative for seizures, syncope and headaches.  Hematological: Negative for adenopathy. Does not bruise/bleed easily.  Psychiatric/Behavioral: Negative for confusion.    Blood pressure 124/78, pulse 87, temperature 96.1 F (35.6 C), temperature source Temporal, height 5' 2.5" (1.588 m), weight 152 lb 12.8 oz (69.31 kg), SpO2 98.00%.  Physical Exam Physical Exam  Constitutional: She is oriented to person, place, and time. She appears well-developed and well-nourished. No distress.  HENT:  Head: Normocephalic and atraumatic.  Nose: Nose normal.  Mouth/Throat: No oropharyngeal exudate.  Eyes: Conjunctivae and EOM are normal. Pupils are equal, round, and reactive to light. Left eye exhibits no discharge. No scleral icterus.  Neck: Neck supple. No JVD present. No tracheal deviation present. No thyromegaly present.  Cardiovascular: Normal rate, regular rhythm, normal heart sounds  and intact distal pulses.   No murmur heard. Pulmonary/Chest: Effort normal and breath sounds normal. No respiratory distress. She has no wheezes. She has no rales. She exhibits no tenderness.  Right mastectomy scar well healed. No palpable nodules, ulcerations, or adenopathy. Left breast at least medium size. Soft. Lumpy. Area of thickening above areolar margin at 11:30 position. Healed scars. No axillary adenopathy.  Abdominal: Soft. Bowel sounds are normal. She exhibits no distension and no mass. There is no tenderness. There is no rebound and no guarding.  Musculoskeletal: She exhibits no edema and no tenderness.  Lymphadenopathy:    She has no cervical adenopathy.  Neurological: She is alert and oriented to person, place, and time. She exhibits normal muscle tone. Coordination normal.  Skin: Skin is warm. No rash noted. She is not diaphoretic. No erythema. No pallor.  Psychiatric: She has a normal mood and affect. Her behavior is normal. Judgment and thought content normal.    Data Reviewed   Assessment    Multifocal, recurrent, atypical lobular neoplasia left breast. High risk.  Past history right partial mastectomy and lymph node dissection 1998, stem cell transplant, recurrence, and salvage mastectomy 2007 as described above. No evidence of local recurrence on the right  Recent genetic testing negative for deleterious mutation  History tobacco abuse  Past history cerebrovascular  accident 1998  Hypertension  Borderline diabetes  Family history breast cancer and ovarian cancer with family members negative for deleterious mutation.     Plan    Scheduled for left total mastectomy at [facility] in the near future. She will need to stay at least one night.  We talked about whether to do a sentinel node biopsy. We both agree that this did not seem to be indicated. She knows there is a small risk of occult carcinoma. She knows there is a small chance we might have to do do  an axillary lymph node biopsy a later date. Doing this for a prophylactic mastectomy did not seem to be worth the risk.  We discussed indications, details, and techniques, and numerous risk of the surgery. She is aware of the risk of bleeding, infection, skin necrosis, but reoperative surgery, or swelling, nerve damage with chronic pain or numbness, shoulder disability, cardiac pulmonary thromboembolic problems, as well as other unforeseen problems. She understands all these issues and all of her questions rancher. She is in full agreement with this plan.  We're going to hold off on this surgery until late June because her husband is getting a GI workup because of some new GI symptoms. That needs to be settled  before she goes into the hospital.         Angelia Mould. Derrell Lolling, M.D., [facility] General and Minimally invasive Surgery Breast and Colorectal Surgery Office:   [phone] Pager:   [phone]  12/04/2012, 9:43 AM

## 2012-12-07 ENCOUNTER — Encounter (HOSPITAL_COMMUNITY): Payer: Self-pay | Admitting: Pharmacy Technician

## 2012-12-26 NOTE — Pre-Procedure Instructions (Signed)
Anna Frank  12/26/2012   Your procedure is scheduled on:  Tuesday June 17,2014  Report to [facility] at 0900 AM.  Call this number if you have problems the morning of surgery: [phone]   Remember:   Do not eat food or drink liquids after midnight Monday   Take these medicines the morning of surgery with A SIP OF WATER:  Flonase nasal spray if needed    Do not wear jewelry, make-up or nail polish.  Do not wear lotions, powders, or perfumes. You may wear deodorant.  Do not shave 48 hours prior to surgery.  Do not bring valuables to the hospital.  [facility] is not responsible                   for any belongings or valuables.  Contacts, dentures or bridgework may not be worn into surgery.  Leave suitcase in the car. After surgery it may be brought to your room.  For patients admitted to the hospital, checkout time is 11:00 AM the day of  discharge.   Patients discharged the day of surgery will not be allowed to drive  home.    Special Instructions: Shower using CHG 2 nights before surgery and the night before surgery.  If you shower the day of surgery use CHG.  Use special wash - you have one bottle of CHG for all showers.  You should use approximately 1/3 of the bottle for each shower.   Please read over the following fact sheets that you were given: Pain Booklet, Coughing and Deep Breathing, MRSA Information and Surgical Site Infection Prevention

## 2012-12-27 ENCOUNTER — Encounter (HOSPITAL_COMMUNITY)
Admission: RE | Admit: 2012-12-27 | Discharge: 2012-12-27 | Disposition: A | Discharge status: Home / Self Care | Payer: Medicare Other | Source: Ambulatory Visit | Attending: General Surgery | Admitting: General Surgery

## 2012-12-27 ENCOUNTER — Ambulatory Visit (HOSPITAL_COMMUNITY): Admission: RE | Admit: 2012-12-27 | Payer: Medicare Other | Source: Ambulatory Visit

## 2012-12-27 ENCOUNTER — Encounter (HOSPITAL_COMMUNITY): Payer: Self-pay

## 2012-12-27 HISTORY — DX: Other specified postprocedural states: Z98.890

## 2012-12-27 HISTORY — DX: Hyperlipidemia, unspecified: E78.5

## 2012-12-27 HISTORY — DX: Nausea with vomiting, unspecified: R11.2

## 2012-12-27 LAB — URINALYSIS, ROUTINE W REFLEX MICROSCOPIC
Glucose, UA: NEGATIVE mg/dL
Hgb urine dipstick: NEGATIVE
Ketones, ur: 15 mg/dL — AB
Leukocytes, UA: NEGATIVE
pH: 5 (ref 5.0–8.0)

## 2012-12-27 LAB — CBC WITH DIFFERENTIAL/PLATELET
Basophils Absolute: 0 10*3/uL (ref 0.0–0.1)
Basophils Relative: 0 % (ref 0–1)
Eosinophils Absolute: 0.3 10*3/uL (ref 0.0–0.7)
MCH: 29.5 pg (ref 26.0–34.0)
MCHC: 33.8 g/dL (ref 30.0–36.0)
Monocytes Relative: 9 % (ref 3–12)
Neutro Abs: 4.5 10*3/uL (ref 1.7–7.7)
Neutrophils Relative %: 54 % (ref 43–77)
Platelets: 245 10*3/uL (ref 150–400)
RDW: 13.1 % (ref 11.5–15.5)

## 2012-12-27 LAB — COMPREHENSIVE METABOLIC PANEL
AST: 20 U/L (ref 0–37)
Albumin: 4.1 g/dL (ref 3.5–5.2)
Alkaline Phosphatase: 74 U/L (ref 39–117)
BUN: 17 mg/dL (ref 6–23)
Chloride: 101 mEq/L (ref 96–112)
Potassium: 3.7 mEq/L (ref 3.5–5.1)
Sodium: 138 mEq/L (ref 135–145)
Total Protein: 6.8 g/dL (ref 6.0–8.3)

## 2012-12-27 LAB — SURGICAL PCR SCREEN
MRSA, PCR: NEGATIVE
Staphylococcus aureus: NEGATIVE

## 2012-12-28 NOTE — Progress Notes (Signed)
Anesthesia Chart Review:  Patient is a 67 year old scheduled for a left mastectomy on 01/01/13 by Dr. Derrell Lolling.  History includes breast cancer s/p right lumpectomy and stem cell transplant ([facility]) '98 and right mastectomy '07, "moderate" ETOH use, DM2, HTN, hypercholesterolemia, CVA '98, post-operative N/V, former smoker.  [place]    EKG on 12/27/12 showed NSR, LAFB, septal infarct (age undetermined).  LAD and poor r wave progression were also noted on a prior EKG from 09/08/05 (see Muse).  No CV symptoms were documented at her PAT visit or at his last office visit with Dr. Derrell Lolling.    CXR on 03/12/12 showed no acute cardiopulmonary disease.  Preoperative labs noted.  She will be evaluated by her assigned anesthesiologist on the day of surgery, but if she remains asymptomatic from a CV standpoint then I would anticipate that she could proceed as planned.  Velna Ochs [facility]/Anesthesiology Phone [phone] 12/28/2012 10:23 AM

## 2012-12-30 NOTE — H&P (Signed)
Anna Frank   MRN:  [id number]   Description: 67 year old female  Provider: Ernestene Mention, MD  Department: [facility]        Diagnoses    Abnormal MRI, breast    -  Primary    [id number]      Reason for Visit    Follow-up    reck and discuss sx        Current Vitals - Last Recorded    BP Pulse Temp(Src) Ht Wt BMI    124/78 87 96.1 F (35.6 C) (Temporal) 5' 2.5" (1.588 m) 152 lb 12.8 oz (69.31 kg) 27.48 kg/m2                  History and Physical   Ernestene Mention, MD     Status: Signed                         HPI Anna Frank is a 67 y.o. female.  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   She has seen Dr. Kelly Splinter and has discussed reconstructive options. She has decided that she does not want reconstruction but that she does want to go ahead and have a left total mastectomy sometime this summer.    Recall that this patient has a significant history of cancer of the right breast. In July 1998 she underwent right partial mastectomy and axillary lymph node dissection by Dr. Francina Ames for stage T2, N2 cancer. She recurred and underwent right total mastectomy by Dr. Maple Hudson on 09/12/2005 and then a stem cell transplant along with chemotherapy and adjuvant Femara at [facility] by Dr. Kandice Hams. She has had surgical procedures [facility] Pax as well   She's had left breast biopsy in May of last year by Dr. Michel Harrow revealing 3 separate foci of atypical lobular neoplasia and close clinical followup was recommended   Recent screening mammograms with Dr. Yolanda Bonine showed density in the left breast at the 11:00 position. There was focal uptake on gamma imaging. Biopsy showed atypical lobular neoplasia.    More recently an MRI guided biopsy revealed atypical lobular hyperplasia. [place]. This is similar histology to the previous lumpectomy by Dr. Michel Harrow at [facility]. . She has discussed  management of her high risk left breast status with Dr. Mariel Sleet. She's discussed this at length and is now ready to go ahead with prophylactic left  mastectomy. She is aware that there is a small but definite cancer may be a occult cancer in the left breast.   She has significant comorbidities including history of stroke, past history of smoking, hypertension, non-insulin-dependent diabetes.   It turns out that her cousins who have breast cancer and a sister who had ovarian cancer were tested for BRCA, but they were negative.  The patient had genetic testing which was negative for deleterious mutation    Today we had a 35-45 minute talk.   She would like the mastectomy incision to be low medially and high laterally I told her that was reasonable.        Past Medical History   Diagnosis  Date   .  Blood transfusion     .  Diabetes mellitus     .  Hypertension     .  Stroke     .  Cancer     .  Breast cancer  RIGHT BREAST         Past Surgical History   Procedure  Laterality  Date   .  Breast surgery    1998       lumpectomy   .  Breast surgery    2007       mastectomy   .  Mastectomy    1998       right breast         Family History   Problem  Relation  Age of Onset   .  Heart disease  Mother     .  Cancer  Sister         ovarian   .  Kidney cancer  Sister     .  Prostate cancer  Paternal Uncle     .  Prostate cancer  Paternal Grandfather     .  Breast cancer  Cousin         4 paternal cousins with breast cancer in thier 24s        Social History History   Substance Use Topics   .  Smoking status:  Former Smoker -- 1.00 packs/day for 20 years   .  Smokeless tobacco:  Former Neurosurgeon       Quit date:  08/29/1996   .  Alcohol Use:  Yes         Comment: 3 per week        No Known Allergies    Current Outpatient Prescriptions   Medication  Sig  Dispense  Refill   .  Ascorbic Acid (VITAMIN C) 1000 MG tablet  Take 1,000 mg by mouth daily.         Marland Kitchen   aspirin 81 MG tablet  Take 81 mg by mouth daily.         Marland Kitchen  b complex vitamins tablet  Take 1 tablet by mouth daily.         .  fish oil-omega-3 fatty acids 1000 MG capsule  Take 1 g by mouth daily.         .  fluticasone (FLONASE) 50 MCG/ACT nasal spray  Place 2 sprays into the nose as needed for rhinitis.         .  Liraglutide (VICTOZA [place])  Inject 6.5 mg into the skin daily.         Marland Kitchen  LISINOPRIL PO  Take 25 mg by mouth daily.           Marland Kitchen  METFORMIN HCL PO  Take 2,000 mg by mouth 2 (two) times daily.           .  pioglitazone (ACTOS) 15 MG tablet  Take 15 mg by mouth daily. Pt states she is trying to come off by not taking it every day         .  raloxifene (EVISTA) 60 MG tablet  Take 1 tablet (60 mg total) by mouth daily.   30 tablet   12   .  SIMVASTATIN PO  Take 20 mg by mouth daily.          .  vitamin E 1000 UNIT capsule  Take 1,000 Units by mouth daily.             No current facility-administered medications for this visit.        Review of Systems   Constitutional: Negative for fever, chills and unexpected weight change.  HENT: Negative for hearing loss, congestion, sore throat, trouble  swallowing and voice change.   Eyes: Negative for visual disturbance.  Respiratory: Negative for cough and wheezing.   Cardiovascular: Negative for chest pain, palpitations and leg swelling.  Gastrointestinal: Negative for nausea, vomiting, abdominal pain, diarrhea, constipation, blood in stool, abdominal distention and anal bleeding.  Genitourinary: Negative for hematuria, vaginal bleeding and difficulty urinating.  Musculoskeletal: Negative for arthralgias.  Skin: Negative for rash and wound.  Neurological: Negative for seizures, syncope and headaches.  Hematological: Negative for adenopathy. Does not bruise/bleed easily.  Psychiatric/Behavioral: Negative for confusion.      Blood pressure 124/78, pulse 87, temperature 96.1 F (35.6 C), temperature source Temporal, height 5' 2.5"  (1.588 m), weight 152 lb 12.8 oz (69.31 kg), SpO2 98.00%.   Physical Exam   Constitutional: She is oriented to person, place, and time. She appears well-developed and well-nourished. No distress.  HENT:   Head: Normocephalic and atraumatic.   Nose: Nose normal.   Mouth/Throat: No oropharyngeal exudate.  Eyes: Conjunctivae and EOM are normal. Pupils are equal, round, and reactive to light. Left eye exhibits no discharge. No scleral icterus.  Neck: Neck supple. No JVD present. No tracheal deviation present. No thyromegaly present.  Cardiovascular: Normal rate, regular rhythm, normal heart sounds and intact distal pulses.    No murmur heard. Pulmonary/Chest: Effort normal and breath sounds normal. No respiratory distress. She has no wheezes. She has no rales. She exhibits no tenderness.  Right mastectomy scar well healed. No palpable nodules, ulcerations, or adenopathy. Left breast at least medium size. Soft. Lumpy. Area of thickening above areolar margin at 11:30 position. Healed scars. No axillary adenopathy.  Abdominal: Soft. Bowel sounds are normal. She exhibits no distension and no mass. There is no tenderness. There is no rebound and no guarding.  Musculoskeletal: She exhibits no edema and no tenderness.  Lymphadenopathy:    She has no cervical adenopathy.  Neurological: She is alert and oriented to person, place, and time. She exhibits normal muscle tone. Coordination normal.  Skin: Skin is warm. No rash noted. She is not diaphoretic. No erythema. No pallor.  Psychiatric: She has a normal mood and affect. Her behavior is normal. Judgment and thought content normal.      Data Reviewed     Assessment    Multifocal, recurrent, atypical lobular neoplasia left breast. High risk.   Past history right partial mastectomy and lymph node dissection 1998, stem cell transplant, recurrence, and salvage mastectomy 2007 as described above. No evidence of local recurrence on the  right   Recent genetic testing negative for deleterious mutation   History tobacco abuse   Past history cerebrovascular  accident 1998   Hypertension   Borderline diabetes   Family history breast cancer and ovarian cancer with family members negative for deleterious mutation.      Plan     Scheduled for left total mastectomy at [facility] in the near future. She will need to stay at least one night.   We talked about whether to do a sentinel node biopsy. We both agree that this did not seem to be indicated. She knows there is a small risk of occult carcinoma. She knows there is a small chance we might have to  do an axillary lymph node biopsy a later date. Doing this for a prophylactic mastectomy did not seem to be worth the risk.   We discussed indications, details, and techniques, and numerous risk of the surgery. She is aware of the risk of bleeding, infection, skin necrosis, but  reoperative surgery, or swelling, nerve damage with chronic pain or numbness, shoulder disability, cardiac pulmonary thromboembolic problems, as well as other unforeseen problems. She understands all these issues and all of her questions are answered. She is in full agreement with this plan.   We're going to hold off on this surgery until late June because her husband is getting a GI workup because of some new GI symptoms. That needs to be settled  before she goes into the hospital.              Angelia Mould. Derrell Lolling, M.D., [facility] General and Minimally invasive Surgery Breast and Colorectal Surgery Office:   [phone] Pager:   [phone]

## 2012-12-31 ENCOUNTER — Telehealth (INDEPENDENT_AMBULATORY_CARE_PROVIDER_SITE_OTHER): Payer: Self-pay

## 2012-12-31 MED ORDER — CEFAZOLIN SODIUM-DEXTROSE 2-3 GM-% IV SOLR
2.0000 g | INTRAVENOUS | Status: AC
  Administered 2013-01-01: 2 g via INTRAVENOUS
  Filled 2012-12-31: qty 50

## 2012-12-31 NOTE — Progress Notes (Signed)
Pt called to arrive at 830.  spoke with pt.845 earliest might be able to arrive

## 2012-12-31 NOTE — Telephone Encounter (Signed)
Patient called regarding her surgery tomorrow.  She is being treated by a Chiropractor for back pain and has been using ice.  She asked if that will be a problem.  I told her it shouldn't.  They can give her ice when she gets to her room to lay on.  She asked what toiletries to bring.  I told her toothbrush, facial cleansers, no makeup, slippers, and something comfortable to wear going home.  I told her about 2nd to [employer] and the camisoles they have because she will have drains postop.  She has a medical supply store near her and she will look there.

## 2013-01-01 ENCOUNTER — Ambulatory Visit (HOSPITAL_COMMUNITY): Payer: Medicare Other | Admitting: Certified Registered"

## 2013-01-01 ENCOUNTER — Encounter (HOSPITAL_COMMUNITY): Payer: Self-pay | Admitting: *Deleted

## 2013-01-01 ENCOUNTER — Encounter (HOSPITAL_COMMUNITY)
Admission: RE | Disposition: A | Discharge status: Home / Self Care | Payer: Self-pay | Source: Ambulatory Visit | Attending: General Surgery

## 2013-01-01 ENCOUNTER — Observation Stay (HOSPITAL_COMMUNITY)
Admission: RE | Admit: 2013-01-01 | Discharge: 2013-01-02 | Disposition: A | Discharge status: Home / Self Care | Payer: Medicare Other | Source: Ambulatory Visit | Attending: General Surgery | Admitting: General Surgery

## 2013-01-01 ENCOUNTER — Encounter (HOSPITAL_COMMUNITY): Payer: Self-pay | Admitting: Vascular Surgery

## 2013-01-01 DIAGNOSIS — Z79899 Other long term (current) drug therapy: Secondary | ICD-10-CM | POA: Insufficient documentation

## 2013-01-01 DIAGNOSIS — Z8673 Personal history of transient ischemic attack (TIA), and cerebral infarction without residual deficits: Secondary | ICD-10-CM | POA: Insufficient documentation

## 2013-01-01 DIAGNOSIS — R920 Mammographic microcalcification found on diagnostic imaging of breast: Secondary | ICD-10-CM | POA: Insufficient documentation

## 2013-01-01 DIAGNOSIS — N6089 Other benign mammary dysplasias of unspecified breast: Principal | ICD-10-CM | POA: Insufficient documentation

## 2013-01-01 DIAGNOSIS — E119 Type 2 diabetes mellitus without complications: Secondary | ICD-10-CM | POA: Insufficient documentation

## 2013-01-01 DIAGNOSIS — Z01812 Encounter for preprocedural laboratory examination: Secondary | ICD-10-CM | POA: Insufficient documentation

## 2013-01-01 DIAGNOSIS — Z01810 Encounter for preprocedural cardiovascular examination: Secondary | ICD-10-CM | POA: Insufficient documentation

## 2013-01-01 DIAGNOSIS — I10 Essential (primary) hypertension: Secondary | ICD-10-CM | POA: Insufficient documentation

## 2013-01-01 DIAGNOSIS — D4862 Neoplasm of uncertain behavior of left breast: Secondary | ICD-10-CM

## 2013-01-01 DIAGNOSIS — R112 Nausea with vomiting, unspecified: Secondary | ICD-10-CM | POA: Insufficient documentation

## 2013-01-01 DIAGNOSIS — Z853 Personal history of malignant neoplasm of breast: Secondary | ICD-10-CM | POA: Insufficient documentation

## 2013-01-01 DIAGNOSIS — D4860 Neoplasm of uncertain behavior of unspecified breast: Secondary | ICD-10-CM | POA: Diagnosis present

## 2013-01-01 DIAGNOSIS — Z9010 Acquired absence of unspecified breast and nipple: Secondary | ICD-10-CM | POA: Insufficient documentation

## 2013-01-01 HISTORY — PX: TOTAL MASTECTOMY: SHX6129

## 2013-01-01 HISTORY — PX: MASTECTOMY COMPLETE / SIMPLE: SUR845

## 2013-01-01 HISTORY — DX: Type 2 diabetes mellitus without complications: E11.9

## 2013-01-01 HISTORY — DX: Family history of other specified conditions: Z84.89

## 2013-01-01 LAB — GLUCOSE, CAPILLARY
Glucose-Capillary: 240 mg/dL — ABNORMAL HIGH (ref 70–99)
Glucose-Capillary: 199 mg/dL — ABNORMAL HIGH (ref 70–99)
Glucose-Capillary: 179 mg/dL — ABNORMAL HIGH (ref 70–99)

## 2013-01-01 SURGERY — MASTECTOMY, SIMPLE
Anesthesia: General | Site: Breast | Laterality: Left | Wound class: Clean

## 2013-01-01 MED ORDER — CEFAZOLIN SODIUM-DEXTROSE 2-3 GM-% IV SOLR
2.0000 g | Freq: Three times a day (TID) | INTRAVENOUS | Status: AC
  Administered 2013-01-01 – 2013-01-02 (×3): 2 g via INTRAVENOUS
  Filled 2013-01-01 (×3): qty 50

## 2013-01-01 MED ORDER — POTASSIUM CHLORIDE IN NACL 20-0.9 MEQ/L-% IV SOLN
INTRAVENOUS | Status: DC
  Administered 2013-01-01 – 2013-01-02 (×2): via INTRAVENOUS
  Filled 2013-01-01 (×3): qty 1000

## 2013-01-01 MED ORDER — ONDANSETRON HCL 4 MG PO TABS: 4.0000 mg | ORAL_TABLET | Freq: Four times a day (QID) | ORAL | Status: DC | PRN

## 2013-01-01 MED ORDER — LIDOCAINE HCL (CARDIAC) 20 MG/ML IV SOLN
INTRAVENOUS | Status: DC | PRN
  Administered 2013-01-01: 60 mg via INTRAVENOUS

## 2013-01-01 MED ORDER — HYDROMORPHONE HCL PF 1 MG/ML IJ SOLN
0.2500 mg | INTRAMUSCULAR | Status: DC | PRN
  Administered 2013-01-01 (×3): 0.5 mg via INTRAVENOUS

## 2013-01-01 MED ORDER — ONDANSETRON HCL 4 MG/2ML IJ SOLN
INTRAMUSCULAR | Status: AC
  Filled 2013-01-01: qty 2

## 2013-01-01 MED ORDER — SIMVASTATIN 20 MG PO TABS
20.0000 mg | ORAL_TABLET | Freq: Every evening | ORAL | Status: DC
  Administered 2013-01-01: 20 mg via ORAL
  Filled 2013-01-01 (×2): qty 1

## 2013-01-01 MED ORDER — LISINOPRIL 10 MG PO TABS
10.0000 mg | ORAL_TABLET | Freq: Every day | ORAL | Status: DC
  Administered 2013-01-01 – 2013-01-02 (×2): 10 mg via ORAL
  Filled 2013-01-01 (×2): qty 1

## 2013-01-01 MED ORDER — HYDROMORPHONE HCL PF 1 MG/ML IJ SOLN
INTRAMUSCULAR | Status: AC
  Filled 2013-01-01: qty 1

## 2013-01-01 MED ORDER — LACTATED RINGERS IV SOLN
INTRAVENOUS | Status: DC
  Administered 2013-01-01: 10:00:00 via INTRAVENOUS

## 2013-01-01 MED ORDER — HYDROMORPHONE HCL PF 1 MG/ML IJ SOLN: 0.5000 mg | INTRAMUSCULAR | Status: DC | PRN

## 2013-01-01 MED ORDER — ONDANSETRON HCL 4 MG/2ML IJ SOLN
4.0000 mg | Freq: Four times a day (QID) | INTRAMUSCULAR | Status: DC | PRN
  Administered 2013-01-01: 4 mg via INTRAVENOUS
  Filled 2013-01-01: qty 2

## 2013-01-01 MED ORDER — RALOXIFENE HCL 60 MG PO TABS
60.0000 mg | ORAL_TABLET | Freq: Every day | ORAL | Status: DC
  Administered 2013-01-01 – 2013-01-02 (×2): 60 mg via ORAL
  Filled 2013-01-01 (×2): qty 1

## 2013-01-01 MED ORDER — ONDANSETRON HCL 4 MG/2ML IJ SOLN
INTRAMUSCULAR | Status: DC | PRN
  Administered 2013-01-01: 4 mg via INTRAVENOUS

## 2013-01-01 MED ORDER — INSULIN ASPART 100 UNIT/ML ~~LOC~~ SOLN
0.0000 [IU] | Freq: Three times a day (TID) | SUBCUTANEOUS | Status: DC
  Administered 2013-01-01: 3 [IU] via SUBCUTANEOUS
  Administered 2013-01-02: 2 [IU] via SUBCUTANEOUS

## 2013-01-01 MED ORDER — FENTANYL CITRATE 0.05 MG/ML IJ SOLN
INTRAMUSCULAR | Status: DC | PRN
  Administered 2013-01-01: 50 ug via INTRAVENOUS
  Administered 2013-01-01: 100 ug via INTRAVENOUS
  Administered 2013-01-01: 50 ug via INTRAVENOUS

## 2013-01-01 MED ORDER — GLYCOPYRROLATE 0.2 MG/ML IJ SOLN
INTRAMUSCULAR | Status: DC | PRN
  Administered 2013-01-01: 0.2 mg via INTRAVENOUS
  Administered 2013-01-01: 0.4 mg via INTRAVENOUS

## 2013-01-01 MED ORDER — 0.9 % SODIUM CHLORIDE (POUR BTL) OPTIME
TOPICAL | Status: DC | PRN
  Administered 2013-01-01: 1000 mL

## 2013-01-01 MED ORDER — METFORMIN HCL 500 MG PO TABS
1000.0000 mg | ORAL_TABLET | Freq: Two times a day (BID) | ORAL | Status: DC
  Administered 2013-01-01 – 2013-01-02 (×2): 1000 mg via ORAL
  Filled 2013-01-01 (×3): qty 2

## 2013-01-01 MED ORDER — FLUTICASONE PROPIONATE 50 MCG/ACT NA SUSP
2.0000 | NASAL | Status: DC | PRN
  Filled 2013-01-01: qty 16

## 2013-01-01 MED ORDER — FENTANYL CITRATE 0.05 MG/ML IJ SOLN
INTRAMUSCULAR | Status: AC
  Administered 2013-01-01: 50 ug via INTRAVENOUS
  Filled 2013-01-01: qty 2

## 2013-01-01 MED ORDER — PROMETHAZINE HCL 25 MG/ML IJ SOLN
INTRAMUSCULAR | Status: AC
  Filled 2013-01-01: qty 1

## 2013-01-01 MED ORDER — INSULIN GLARGINE 100 UNIT/ML ~~LOC~~ SOLN
10.0000 [IU] | Freq: Every day | SUBCUTANEOUS | Status: DC
  Administered 2013-01-01: 10 [IU] via SUBCUTANEOUS
  Filled 2013-01-01 (×2): qty 0.1

## 2013-01-01 MED ORDER — NEOSTIGMINE METHYLSULFATE 1 MG/ML IJ SOLN
INTRAMUSCULAR | Status: DC | PRN
  Administered 2013-01-01: 3 mg via INTRAVENOUS

## 2013-01-01 MED ORDER — PROPOFOL 10 MG/ML IV BOLUS
INTRAVENOUS | Status: DC | PRN
  Administered 2013-01-01: 150 mg via INTRAVENOUS

## 2013-01-01 MED ORDER — FENTANYL CITRATE 0.05 MG/ML IJ SOLN
100.0000 ug | Freq: Once | INTRAMUSCULAR | Status: AC
  Administered 2013-01-01: 50 ug via INTRAVENOUS

## 2013-01-01 MED ORDER — ROCURONIUM BROMIDE 100 MG/10ML IV SOLN
INTRAVENOUS | Status: DC | PRN
  Administered 2013-01-01: 25 mg via INTRAVENOUS

## 2013-01-01 MED ORDER — PIOGLITAZONE HCL 15 MG PO TABS
15.0000 mg | ORAL_TABLET | Freq: Every day | ORAL | Status: DC
  Administered 2013-01-01 – 2013-01-02 (×2): 15 mg via ORAL
  Filled 2013-01-01 (×2): qty 1

## 2013-01-01 MED ORDER — HEPARIN SODIUM (PORCINE) 5000 UNIT/ML IJ SOLN
5000.0000 [IU] | Freq: Three times a day (TID) | INTRAMUSCULAR | Status: DC
Start: 2013-01-02 — End: 2013-01-02
  Filled 2013-01-01 (×3): qty 1

## 2013-01-01 MED ORDER — EPHEDRINE SULFATE 50 MG/ML IJ SOLN
INTRAMUSCULAR | Status: DC | PRN
  Administered 2013-01-01: 5 mg via INTRAVENOUS
  Administered 2013-01-01: 20 mg via INTRAVENOUS
  Administered 2013-01-01: 5 mg via INTRAVENOUS

## 2013-01-01 MED ORDER — ARTIFICIAL TEARS OP OINT
TOPICAL_OINTMENT | OPHTHALMIC | Status: DC | PRN
  Administered 2013-01-01: 1 via OPHTHALMIC

## 2013-01-01 MED ORDER — CHLORHEXIDINE GLUCONATE 4 % EX LIQD: 1.0000 "application " | Freq: Once | CUTANEOUS | Status: DC

## 2013-01-01 MED ORDER — ONDANSETRON HCL 4 MG/2ML IJ SOLN
4.0000 mg | Freq: Once | INTRAMUSCULAR | Status: AC | PRN
  Administered 2013-01-01: 4 mg via INTRAVENOUS

## 2013-01-01 MED ORDER — LACTATED RINGERS IV SOLN
INTRAVENOUS | Status: DC | PRN
  Administered 2013-01-01 (×2): via INTRAVENOUS

## 2013-01-01 MED ORDER — OXYCODONE-ACETAMINOPHEN 5-325 MG PO TABS: 1.0000 | ORAL_TABLET | ORAL | Status: DC | PRN

## 2013-01-01 MED ORDER — PROMETHAZINE HCL 25 MG/ML IJ SOLN
6.2500 mg | INTRAMUSCULAR | Status: DC | PRN
  Administered 2013-01-01: 6.25 mg via INTRAVENOUS

## 2013-01-01 SURGICAL SUPPLY — 51 items
ADH SKN CLS APL DERMABOND .7 (GAUZE/BANDAGES/DRESSINGS) ×1
ADH SKN CLS LQ APL DERMABOND (GAUZE/BANDAGES/DRESSINGS) ×1
APPLIER CLIP 9.375 MED OPEN (MISCELLANEOUS) ×2
APR CLP MED 9.3 20 MLT OPN (MISCELLANEOUS) ×1
BINDER BREAST LRG (GAUZE/BANDAGES/DRESSINGS) IMPLANT
BINDER BREAST XLRG (GAUZE/BANDAGES/DRESSINGS) ×1 IMPLANT
CANISTER SUCTION 2500CC (MISCELLANEOUS) ×2 IMPLANT
CHLORAPREP W/TINT 26ML (MISCELLANEOUS) ×2 IMPLANT
CLIP APPLIE 9.375 MED OPEN (MISCELLANEOUS) ×1 IMPLANT
CLOTH BEACON ORANGE TIMEOUT ST (SAFETY) ×2 IMPLANT
COVER SURGICAL LIGHT HANDLE (MISCELLANEOUS) ×2 IMPLANT
DERMABOND ADHESIVE PROPEN (GAUZE/BANDAGES/DRESSINGS) ×1
DERMABOND ADVANCED (GAUZE/BANDAGES/DRESSINGS) ×1
DERMABOND ADVANCED .7 DNX12 (GAUZE/BANDAGES/DRESSINGS) ×1 IMPLANT
DERMABOND ADVANCED .7 DNX6 (GAUZE/BANDAGES/DRESSINGS) IMPLANT
DRAIN CHANNEL 19F RND (DRAIN) ×3 IMPLANT
DRAPE LAPAROSCOPIC ABDOMINAL (DRAPES) ×2 IMPLANT
DRAPE PROXIMA HALF (DRAPES) ×1 IMPLANT
DRAPE UTILITY 15X26 W/TAPE STR (DRAPE) ×4 IMPLANT
DRSG PAD ABDOMINAL 8X10 ST (GAUZE/BANDAGES/DRESSINGS) ×2 IMPLANT
ELECT BLADE 4.0 EZ CLEAN MEGAD (MISCELLANEOUS) ×2
ELECT CAUTERY BLADE 6.4 (BLADE) ×2 IMPLANT
ELECT REM PT RETURN 9FT ADLT (ELECTROSURGICAL) ×2
ELECTRODE BLDE 4.0 EZ CLN MEGD (MISCELLANEOUS) ×1 IMPLANT
ELECTRODE REM PT RTRN 9FT ADLT (ELECTROSURGICAL) ×1 IMPLANT
EVACUATOR SILICONE 100CC (DRAIN) ×3 IMPLANT
GLOVE BIOGEL PI IND STRL 7.0 (GLOVE) IMPLANT
GLOVE BIOGEL PI IND STRL 8 (GLOVE) IMPLANT
GLOVE BIOGEL PI INDICATOR 7.0 (GLOVE) ×1
GLOVE BIOGEL PI INDICATOR 8 (GLOVE) ×1
GLOVE ECLIPSE 8.0 STRL XLNG CF (GLOVE) ×1 IMPLANT
GLOVE EUDERMIC 7 POWDERFREE (GLOVE) ×2 IMPLANT
GLOVE SKINSENSE NS SZ7.0 (GLOVE) ×1
GLOVE SKINSENSE STRL SZ7.0 (GLOVE) IMPLANT
GOWN STRL NON-REIN LRG LVL3 (GOWN DISPOSABLE) ×4 IMPLANT
GOWN STRL REIN XL XLG (GOWN DISPOSABLE) ×3 IMPLANT
KIT BASIN OR (CUSTOM PROCEDURE TRAY) ×2 IMPLANT
KIT ROOM TURNOVER OR (KITS) ×2 IMPLANT
NS IRRIG 1000ML POUR BTL (IV SOLUTION) ×2 IMPLANT
PACK GENERAL/GYN (CUSTOM PROCEDURE TRAY) ×2 IMPLANT
PAD ARMBOARD 7.5X6 YLW CONV (MISCELLANEOUS) ×2 IMPLANT
SPECIMEN JAR X LARGE (MISCELLANEOUS) ×2 IMPLANT
SPONGE GAUZE 4X4 12PLY (GAUZE/BANDAGES/DRESSINGS) ×1 IMPLANT
SPONGE LAP 18X18 X RAY DECT (DISPOSABLE) ×1 IMPLANT
SUT ETHILON 3 0 FSL (SUTURE) ×2 IMPLANT
SUT MNCRL AB 4-0 PS2 18 (SUTURE) ×3 IMPLANT
SUT SILK 2 0 FS (SUTURE) ×2 IMPLANT
SUT VIC AB 3-0 SH 18 (SUTURE) ×4 IMPLANT
TAPE CLOTH SURG 6X10 WHT LF (GAUZE/BANDAGES/DRESSINGS) ×1 IMPLANT
TOWEL OR 17X24 6PK STRL BLUE (TOWEL DISPOSABLE) ×2 IMPLANT
TOWEL OR 17X26 10 PK STRL BLUE (TOWEL DISPOSABLE) ×2 IMPLANT

## 2013-01-01 NOTE — Anesthesia Preprocedure Evaluation (Signed)
Anesthesia Evaluation  Patient identified by MRN, date of birth, ID band Patient awake    Reviewed: Allergy & Precautions, H&P , Patient's Chart, lab work & pertinent test results  History of Anesthesia Complications (+) PONV  Airway Mallampati: I TM Distance: >3 FB Neck ROM: full    Dental   Pulmonary former smoker,          Cardiovascular hypertension, Rhythm:regular Rate:Normal     Neuro/Psych CVA    GI/Hepatic   Endo/Other  diabetes, Type 2, Oral Hypoglycemic Agents  Renal/GU      Musculoskeletal   Abdominal   Peds  Hematology   Anesthesia Other Findings   Reproductive/Obstetrics                           Anesthesia Physical Anesthesia Plan  ASA: III  Anesthesia Plan: General   Post-op Pain Management:    Induction: Intravenous  Airway Management Planned: Oral ETT  Additional Equipment:   Intra-op Plan:   Post-operative Plan: Extubation in OR  Informed Consent: I have reviewed the patients History and Physical, chart, labs and discussed the procedure including the risks, benefits and alternatives for the proposed anesthesia with the patient or authorized representative who has indicated his/her understanding and acceptance.     Plan Discussed with: CRNA, Anesthesiologist and Surgeon  Anesthesia Plan Comments:         Anesthesia Quick Evaluation

## 2013-01-01 NOTE — Progress Notes (Signed)
Dr. Gypsy Balsam called informed of pt's nausea had received zofran with no relief orders received

## 2013-01-01 NOTE — Anesthesia Procedure Notes (Signed)
Procedure Name: Intubation Date/Time: 01/01/2013 12:20 PM Performed by: Jerilee Hoh Pre-anesthesia Checklist: Patient identified, Emergency Drugs available, Suction available and Patient being monitored Patient Re-evaluated:Patient Re-evaluated prior to inductionOxygen Delivery Method: Circle system utilized Preoxygenation: Pre-oxygenation with 100% oxygen Intubation Type: IV induction Ventilation: Mask ventilation without difficulty Laryngoscope Size: Mac and 3 Grade View: Grade I Tube type: Oral Tube size: 7.5 mm Number of attempts: 1 Airway Equipment and Method: Stylet Placement Confirmation: ETT inserted through vocal cords under direct vision,  positive ETCO2 and breath sounds checked- equal and bilateral Secured at: 21 cm Tube secured with: Tape Dental Injury: Teeth and Oropharynx as per pre-operative assessment

## 2013-01-01 NOTE — Progress Notes (Signed)
DR.Kasik called for a sign out

## 2013-01-01 NOTE — Transfer of Care (Signed)
Immediate Anesthesia Transfer of Care Note  Patient: Anna Frank  Procedure(s) Performed: Procedure(s): TOTAL MASTECTOMY (Left)  Patient Location: PACU  Anesthesia Type:General  Level of Consciousness: awake, alert , oriented and patient cooperative  Airway & Oxygen Therapy: Patient Spontanous Breathing and Patient connected to nasal cannula oxygen  Post-op Assessment: Report given to PACU RN, Post -op Vital signs reviewed and stable and Patient moving all extremities  Post vital signs: Reviewed and stable  Complications: No apparent anesthesia complications

## 2013-01-01 NOTE — Interval H&P Note (Signed)
History and Physical Interval Note:  01/01/2013 11:47 AM  Anna Frank  has presented today for surgery, with the diagnosis of recurrent atypical lobular neoplasia left breast   The goals and the  various methods of treatment have been discussed with the patient and family. After consideration of risks, benefits and other options for treatment, the patient has consented to  Procedure(s): TOTAL MASTECTOMY (Left) as a surgical intervention .  The patient's history has been reviewed, patient examined, no change in status, stable for surgery.  I have reviewed the patient's chart and labs.  Questions were answered to the patient's satisfaction.     Ernestene Mention

## 2013-01-01 NOTE — Op Note (Signed)
Patient Name:           Anna Frank   Date of Surgery:        01/01/2013  Pre op Diagnosis:     1)   Multifocal, recurrent, atypical lobular neoplasia left breast. High risk.  2)  Past history right partial mastectomy and lymph node dissection 1998, stem cell transplant, recurrence, and salvage mastectomy 2007 . No evidence of local recurrence on the right   Post op Diagnosis:    same  Procedure:                 Left total mastectomy  Surgeon:                     Angelia Mould. Derrell Lolling, M.D., FACS  Assistant:                      Karie Soda, M.D., [facility]  Operative Indications:   Anna B Duce is a 67 y.o. female..  Recall that this patient has a significant history of cancer of the right breast. In July 1998 she underwent right partial mastectomy and axillary lymph node dissection by Dr. Francina Ames for stage T2, N2 cancer. She recurred and underwent right total mastectomy by Dr. Maple Hudson on 09/12/2005 and then a stem cell transplant along with chemotherapy and adjuvant Femara at [facility] by Dr. Kandice Hams. She has had surgical procedures [facility] Pax as well  She's had left breast biopsy in May of last year by Dr. 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 Yolanda Bonine showed density in the left breast at the 11:00 position. There was focal uptake on gamma imaging. Biopsy showed atypical lobular neoplasia.  More recently an MRI guided biopsy revealed another area of atypical lobular hyperplasia. . This is similar histology to the previous lumpectomy by Dr. Michel Harrow at [facility]. . She has discussed management of her high risk left breast status with Dr. Mariel Sleet. She's discussed this at length and is now ready to go ahead with prophylactic left mastectomy. She is aware that there is a small but definite cancer may be a occult cancer in the left breast. She has been counseled by a  plastic surgeon but has declined reconstruction. She has significant comorbidities including history of stroke, past history of smoking, hypertension, non-insulin-dependent diabetes.  It turns out that her cousins who have breast cancer and a sister who had ovarian cancer were tested for BRCA, but they were negative. The patient had genetic testing which was negative for deleterious mutation   Operative Findings:       There was lots of scar tissue in the breast due to multiple previous biopsies. Dissection planes were not as easily delineated as usual. There was no gross evidence of cancer in the breast and a palpable mass in the axilla.  Procedure in Detail:          Following the induction of general endotracheal anesthesia the patient's left breast, chest wall and axilla were prepped and draped in a sterile fashion. Intravenous antibiotics were given. Surgical time out was performed. Using a marking pen I marked a oblique elliptical incision which encompassed the nipple and areola, and was low  medially and higher laterally. I was able to encompass one of the scars with this. A transverse elliptical incision was made. Skin and subcutaneous tissue flaps were raised medially to the parasternal  area, superiorly to the infraclavicular area, laterally to the latissimus dorsi muscle and inferiorly to the anterior rectus sheath. The left breast was dissected off of the pectoralis major and minor muscle with electrocautery. Dissection was carried up into the tail of Spence. The lateral skin margin was marked with a silk suture. Specimen was sent to the lab for routine histology. Hemostasis was excellent and achieved with electrocautery and a few metal clips. The wound was extensively irrigated. A single 19 [place] Blake drain was placed in the wound and brought out through a separate stab incision inferolaterally, sutured to the skin and connected to a suction bulb. The subcutaneous tissue was closed with  interrupted sutures of 3-0 Vicryl and the skin closed with a running subcuticular suture of 4-0 Monocryl and Dermabond. After the Dermabond was dry and cushioning bandages and a breast binder were placed. The patient tolerated the procedure well and was taken recovery in stable condition. EBL 100 cc or less. Counts correct. Complications none.     Angelia Mould. Derrell Lolling, M.D., FACS General and Minimally Invasive Surgery Breast and Colorectal Surgery  01/01/2013 2:00 PM

## 2013-01-01 NOTE — Anesthesia Postprocedure Evaluation (Signed)
  Anesthesia Post-op Note  Patient: Anna Frank  Procedure(s) Performed: Procedure(s): TOTAL MASTECTOMY (Left)  Patient Location: PACU  Anesthesia Type:General  Level of Consciousness: awake  Airway and Oxygen Therapy: Patient Spontanous Breathing  Post-op Pain: mild  Post-op Assessment: Post-op Vital signs reviewed, Patient's Cardiovascular Status Stable, Respiratory Function Stable, Patent Airway, No signs of Nausea or vomiting and Pain level controlled  Post-op Vital Signs: stable  Complications: No apparent anesthesia complications

## 2013-01-01 NOTE — Preoperative (Signed)
Beta Blockers   Reason not to administer Beta Blockers:Not Applicable 

## 2013-01-02 ENCOUNTER — Telehealth (INDEPENDENT_AMBULATORY_CARE_PROVIDER_SITE_OTHER): Payer: Self-pay

## 2013-01-02 LAB — GLUCOSE, CAPILLARY: Glucose-Capillary: 150 mg/dL — ABNORMAL HIGH (ref 70–99)

## 2013-01-02 LAB — BASIC METABOLIC PANEL
GFR calc Af Amer: >90 mL/min (ref >90)
GFR calc non Af Amer: >90 mL/min (ref >90)
Potassium: 3.8 mEq/L (ref 3.5–5.1)
Sodium: 137 mEq/L (ref 135–145)

## 2013-01-02 LAB — HEMOGLOBIN A1C: Mean Plasma Glucose: 171 mg/dL — ABNORMAL HIGH (ref <117)

## 2013-01-02 LAB — CBC
MCHC: 32.6 g/dL (ref 30.0–36.0)
Platelets: 193 10*3/uL (ref 150–400)
RDW: 13.2 % (ref 11.5–15.5)
WBC: 8.5 10*3/uL (ref 4.0–10.5)

## 2013-01-02 MED ORDER — HYDROCODONE-ACETAMINOPHEN 5-325 MG PO TABS: 1.0000 | ORAL_TABLET | ORAL | Status: DC | PRN

## 2013-01-02 NOTE — Progress Notes (Signed)
1 Day Post-Op  Subjective: Stable and alert. Minimal pain. Vomited last might but not nauseated this morning. Operative findings discussed with patient.  She is up to bathroom and voiding normally, somewhat high amounts.  IV fluids discontinued.  Objective: Vital signs in last 24 hours: Temp:  [97.5 F (36.4 C)-98.6 F (37 C)] 98.6 F (37 C) (06/18 0545) Pulse Rate:  [58-92] 80 (06/18 0545) Resp:  [12-21] 18 (06/18 0545) BP: (113-174)/(51-96) 113/51 mmHg (06/18 0545) SpO2:  [93 %-99 %] 98 % (06/18 0545) Weight:  [155 lb 6.8 oz (70.5 kg)] 155 lb 6.8 oz (70.5 kg) (06/17 1600) Last BM Date: 12/31/12  Intake/Output from previous day: 06/17 0701 - 06/18 0700 In: 1200 [I.V.:1200] Out: 2050 [Urine:1850; Drains:150; Blood:50] Intake/Output this shift:    General appearance: alert. Cooperative. Mental status normal. No distress. Resp: clear to auscultation bilaterally Breasts: Left mastectomy skin flaps pink, viable. No necrosis. No hematoma. Drain functioning, serosanguineous.  Lab Results:  Results for orders placed during the hospital encounter of 01/01/13 (from the past 24 hour(s))  GLUCOSE, CAPILLARY     Status: Abnormal   Collection Time    01/01/13  9:12 AM      Result Value Range   Glucose-Capillary 240 (*) 70 - 99 mg/dL  GLUCOSE, CAPILLARY     Status: Abnormal   Collection Time    01/01/13  1:58 PM      Result Value Range   Glucose-Capillary 173 (*) 70 - 99 mg/dL   Comment 1 Notify RN    GLUCOSE, CAPILLARY     Status: Abnormal   Collection Time    01/01/13  4:37 PM      Result Value Range   Glucose-Capillary 199 (*) 70 - 99 mg/dL  HEMOGLOBIN G9F     Status: Abnormal   Collection Time    01/01/13  5:00 PM      Result Value Range   Hemoglobin A1C 7.6 (*) <5.7 %   Mean Plasma Glucose 171 (*) <117 mg/dL  GLUCOSE, CAPILLARY     Status: Abnormal   Collection Time    01/01/13  9:43 PM      Result Value Range   Glucose-Capillary 179 (*) 70 - 99 mg/dL  CBC      Status: Abnormal   Collection Time    01/02/13  5:00 AM      Result Value Range   WBC 8.5  4.0 - 10.5 K/uL   RBC 3.78 (*) 3.87 - 5.11 MIL/uL   Hemoglobin 10.7 (*) 12.0 - 15.0 g/dL   HCT 62.1 (*) 30.8 - 65.7 %   MCV 86.8  78.0 - 100.0 fL   MCH 28.3  26.0 - 34.0 pg   MCHC 32.6  30.0 - 36.0 g/dL   RDW 84.6  96.2 - 95.2 %   Platelets 193  150 - 400 K/uL     Studies/Results: @RISRSLT24 @  . heparin  5,000 Units Subcutaneous Q8H  . insulin aspart  0-15 Units Subcutaneous TID WC  . insulin glargine  10 Units Subcutaneous QHS  . lisinopril  10 mg Oral Daily  . metFORMIN  1,000 mg Oral BID WC  . pioglitazone  15 mg Oral Daily  . raloxifene  60 mg Oral Daily  . simvastatin  20 mg Oral QPM     Assessment/Plan: s/p Procedure(s): TOTAL MASTECTOMY  POD #1. Left total mastectomy. Stable.  Advance diet and activities. If no recurrence of nausea and vomiting possible discharge today Drain care and wound  care discussed  Prescription for Vicodin given  Followup in office approximately one week.  @PROBHOSP @  LOS: 1 day    Anna Frank M. Derrell Lolling, M.D., [facility] General and Minimally invasive Surgery Breast and Colorectal Surgery Office:   [phone] Pager:   [phone]  01/02/2013  . .prob

## 2013-01-02 NOTE — Discharge Summary (Signed)
Patient ID: Anna Frank [id number] 67 y.o. 1945/12/15  Admit date: 01/01/2013  Discharge date and time: No discharge date for patient encounter.  Admitting Physician: Ernestene Mention  Discharge Physician: Ernestene Mention  Admission Diagnoses: recurrent atypical lobular neoplasia left breast   Discharge Diagnoses: 1) Multifocal, recurrent, atypical lobular neoplasia left breast. High risk.  2) Past history right partial mastectomy and lymph node dissection 1998, stem cell transplant, recurrence, and salvage mastectomy 2007 . No evidence of local recurrence on the right 3) Non-insulin-dependent diabetes mellitus. 4) hypertension 5) Past history stroke with minimal residual  Operations: Procedure(s): TOTAL MASTECTOMY  Admission Condition: good  Discharged Condition: good  Indication for Admission: Anna Frank is a 67 y.o. female..  This patient has a significant history of cancer of the right breast. In July 1998 she underwent right partial mastectomy and axillary lymph node dissection by Dr. Francina Ames for stage T2, N2 cancer. She recurred and underwent right total mastectomy by Dr. Maple Hudson on 09/12/2005 and then a stem cell transplant along with chemotherapy and adjuvant Femara at [facility] by Dr. Kandice Hams. She has had surgical procedures by Radford Pax as well  She's had left breast biopsy in May of last year by Dr. 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 Yolanda Bonine showed density in the left breast at the 11:00 position. There was focal uptake on gamma imaging. Biopsy showed atypical lobular neoplasia.  More recently an MRI guided biopsy revealed another area of atypical lobular hyperplasia. . This is similar histology to the previous lumpectomy by Dr. Michel Harrow at [facility] . She has discussed management of her high risk left breast status with Dr.  Mariel Sleet. She's discussed this at length and is now ready to go ahead with prophylactic left mastectomy. She is aware that there is a small but definite chance that there may be an occult cancer in the left breast. She has been counseled by a plastic surgeon but has declined reconstruction.  She has significant comorbidities including history of stroke, past history of smoking, hypertension, non-insulin-dependent diabetes.  It turns out that her cousins who have breast cancer and a sister who had ovarian cancer were tested for BRCA, but they were negative. The patient had genetic testing which was negative for deleterious mutation   Hospital Course: On the day of admission the patient was taken to the operating room and underwent a left total mastectomy. The surgery was uneventful. She had nausea and vomiting the evening after surgery but this was resolved by the following morning. On postop day one her skin flaps looked healthy without necrosis noted, no hematoma or unusual bleeding. Drain was functioning and was serosanguineous. She was having minimal pain and wanted to go home. We decided she could be discharged home if she tolerated breakfast reasonably well. Diet and activities were discussed. Prescription for Vicodin was given. Drain care instructions and education were completed. She was asked to return to see me in the office in approximately one week for a wound and drain check. We will call her pathology report to her.  Consults: None  Significant Diagnostic Studies: Surgical pathology, results pending  Treatments: surgery: Left total mastectomy  Disposition: Home  Patient Instructions:    Medication List    TAKE these medications       aspirin 81 MG tablet  Take 81 mg by mouth daily.     b complex vitamins tablet  Take 1 tablet by mouth daily.     fish oil-omega-3 fatty acids 1000 MG capsule  Take 1 g by mouth daily.     fluticasone 50 MCG/ACT nasal spray  Commonly known  as:  FLONASE  Place 2 sprays into the nose as needed for rhinitis.     HYDROcodone-acetaminophen 5-325 MG per tablet  Commonly known as:  NORCO/VICODIN  Take 1-2 tablets by mouth every 4 (four) hours as needed for pain.     lisinopril 10 MG tablet  Commonly known as:  PRINIVIL,ZESTRIL  Take 10 mg by mouth daily.     metFORMIN 1000 MG tablet  Commonly known as:  GLUCOPHAGE  Take 1,000 mg by mouth 2 (two) times daily with a meal.     pioglitazone 15 MG tablet  Commonly known as:  ACTOS  Take 15 mg by mouth daily. Pt states she is trying to come off by not taking it every day     raloxifene 60 MG tablet  Commonly known as:  EVISTA  Take 60 mg by mouth daily.     simvastatin 20 MG tablet  Commonly known as:  ZOCOR  Take 20 mg by mouth every evening.     vitamin C 500 MG tablet  Commonly known as:  ASCORBIC ACID  Take 500 mg by mouth daily.     vitamin E 1000 UNIT capsule  Take 1,000 Units by mouth daily.        Activity: activities were discussed in detail. Some resections of left shoulder and bathing. No driving for 2 weeks. Diet: diabetic diet Wound Care: as directed  Follow-up:  With Dr. Derrell Lolling in 1 week.  Signed: Angelia Mould. Derrell Lolling, M.D., FACS General and minimally invasive surgery Breast and Colorectal Surgery  01/02/2013, 7:54 AM

## 2013-01-02 NOTE — Telephone Encounter (Signed)
I called to see if the patient is home yet and she is not.  Her husband answered.  He asked if I thought she should go home or stay another day.  I told him Dr Derrell Lolling said she was going home today.  I said he wouldn't have discharged if he wasn't comfortable with how she was doing.  I told him she will be a little sore and has drains to keep up with.  I advised he can call our office number any time with questions or concerns and that a dr is on call each night.  I gave him her postop appointment for 01/10/2013 at 0930.

## 2013-01-02 NOTE — Discharge Planning (Signed)
Patient discharged home in stable condition. Verbalizes understanding of all discharge instructions, including home medications and follow up appointments. 

## 2013-01-03 ENCOUNTER — Encounter (HOSPITAL_COMMUNITY): Payer: Self-pay | Admitting: General Surgery

## 2013-01-03 ENCOUNTER — Telehealth (INDEPENDENT_AMBULATORY_CARE_PROVIDER_SITE_OTHER): Payer: Self-pay

## 2013-01-03 NOTE — Telephone Encounter (Signed)
Pt called to speak with Huntley Dec - let pt know that Huntley Dec was with a doctor and that I would be happy to help her if I could.  Pt had 2 questions:  1 - should she keep her chest binder on until her next visit with Dr. Derrell Lolling.  I told her "yes" - that Dr. Derrell Lolling should be the first person to take the binder off. 2 - her drainage has a slight yellow tinge to it that the pt says looks like "chicken fat" pt wondering if this is normal.  I told her yes, light pink/light yellow is normal.  Pt appreciated my answers.

## 2013-01-04 ENCOUNTER — Telehealth (INDEPENDENT_AMBULATORY_CARE_PROVIDER_SITE_OTHER): Payer: Self-pay

## 2013-01-04 NOTE — Telephone Encounter (Signed)
Message copied by Ivory Broad on Fri Jan 04, 2013  1:37 PM ------      Message from: Anna Frank Mention      Created: Fri Jan 04, 2013 12:42 PM       Inform patient of Pathology report,.Tell her there was no cancer found, just atypical typical hyperplasia. This is good news. ------

## 2013-01-04 NOTE — Telephone Encounter (Signed)
I called the pt.  She is doing well.  I gave her the pathology report.  She has an appointment next week.

## 2013-01-04 NOTE — Progress Notes (Signed)
Quick Note:  Inform patient of Pathology report,.Tell her there was no cancer found, just atypical typical hyperplasia. This is good news. ______

## 2013-01-07 ENCOUNTER — Telehealth (INDEPENDENT_AMBULATORY_CARE_PROVIDER_SITE_OTHER): Payer: Self-pay

## 2013-01-07 NOTE — Telephone Encounter (Signed)
The patient called to report she had leakage from her bandage this weekend.  It bled through.  She called and spoke to Dr Maisie Fus this weekend.  She got some bandages and her sister changed the bandage.  She was concerned about changing the bandage because she wasn't expecting that to happen.  She thought her appointment was too far out to wait until Thursday.  I told her let's keep it there because I doubt we would pull the drain sooner.  I told her to keep the bandages clean and dry.

## 2013-01-09 ENCOUNTER — Telehealth (INDEPENDENT_AMBULATORY_CARE_PROVIDER_SITE_OTHER): Payer: Self-pay

## 2013-01-09 NOTE — Telephone Encounter (Signed)
Pt called office to inform Dr. Derrell Lolling / Huntley Dec that her drain has come out on it's own last night while she was sleeping. (s/p total mastectomy DOS 01/01/13)  Pt states that there is very little drainage now.  Pt has appt with Dr. Derrell Lolling tomorrow June 26 at 930am.  Pt stated that she has applied a pressure bandage to the drain site.  Pt advised that the bandage should be sufficient until her visit tomorrow morning and to call the office if she has any more issues.

## 2013-01-10 ENCOUNTER — Encounter (INDEPENDENT_AMBULATORY_CARE_PROVIDER_SITE_OTHER): Payer: Self-pay | Admitting: General Surgery

## 2013-01-10 ENCOUNTER — Ambulatory Visit (INDEPENDENT_AMBULATORY_CARE_PROVIDER_SITE_OTHER): Payer: Medicare Other | Admitting: General Surgery

## 2013-01-10 VITALS — BP 118/72 | HR 70 | Temp 97.0°F | Resp 16 | Ht 62.5 in | Wt 147.8 lb

## 2013-01-10 DIAGNOSIS — D4860 Neoplasm of uncertain behavior of unspecified breast: Secondary | ICD-10-CM

## 2013-01-10 DIAGNOSIS — D4862 Neoplasm of uncertain behavior of left breast: Secondary | ICD-10-CM

## 2013-01-10 NOTE — Progress Notes (Signed)
Patient ID: Anna Frank, female   DOB: [birth year], 67 y.o.   MRN: [id number] History: This patient underwent left total mastectomy on 01/01/2013 for multifocal, recurrent atypical lobular neoplasia of the left breast. She has a past history of cancer of the right breast with recurrence. Final pathology report showed atypical lobular neoplasia but no cancer the margins were negative.She was given a copy of the pathology reported this was discussed with her. She is doing reasonably well. She states that the drain fell out at home a few days ago. She states that it was draining about 30 cc a day at that time. He is still draining somewhat on the dressing.  Exam: Patient looks well. Her husband is with her. Very pleasant as usual. Left mastectomy wound shows skin edges healthy, no necrosis, no infection. There is a little bit of fluid under the mastectomy flaps which I could express out the lateral drain hole. We dressed this with bulky 4 x 4's and ABDs and a breast binder.  Assessment: Recurrent atypical lobular neoplasia left breast, status post prophylactic left total mastectomy. History right breast cancer  Plan wound care discussed. Compressive bandage to be changed twice a day. Advised that she may or may not develop seromas that will need to be evacuated Return to see me in 2 weeks   [place] Urieta M. Derrell Lolling, M.D., [facility] General and Minimally invasive Surgery Breast and Colorectal Surgery Office:   [phone] Pager:   [phone]

## 2013-01-10 NOTE — Patient Instructions (Signed)
Your mastectomy wound looks good. There is no sign of infection.  The drain came out prematurely, before it had done its job. Hopefully the fluid will continue to drain out the hole and everything will heal together.  Change the bandage as instructed, probably twice a day depending on the amount of drainage  Continue to wear the elastic binder.  Return to see Dr. Derrell Lolling in 2 weeks.

## 2013-01-11 ENCOUNTER — Telehealth (INDEPENDENT_AMBULATORY_CARE_PROVIDER_SITE_OTHER): Payer: Self-pay

## 2013-01-11 NOTE — Telephone Encounter (Signed)
Patient called in asking if she can shower. She states she does not have a drain in and that it fell out on its own. I told her she could get in shower and let soap and water run over surgery site and to pat the area dry when she gets out. Then she needs to apply dressing to area once it is dry. Patient will call back if she has any more questions.

## 2013-01-14 ENCOUNTER — Telehealth (INDEPENDENT_AMBULATORY_CARE_PROVIDER_SITE_OTHER): Payer: Self-pay | Admitting: *Deleted

## 2013-01-14 NOTE — Telephone Encounter (Signed)
Patient called to state that she continues to have increasing swelling in her left breast following her mastectomy.  Patient states her drain fell out prematurely and she was told by Dr. Derrell Lolling if it continued to swell she would have to come in to have it drained.  Patient states some tenderness but denies any redness, drainage, or fevers.  Appt scheduled for tomorrow per Huntley Dec, patient agreeable at this time.

## 2013-01-15 ENCOUNTER — Ambulatory Visit (INDEPENDENT_AMBULATORY_CARE_PROVIDER_SITE_OTHER): Payer: Medicare Other | Admitting: General Surgery

## 2013-01-15 ENCOUNTER — Encounter (INDEPENDENT_AMBULATORY_CARE_PROVIDER_SITE_OTHER): Payer: Self-pay | Admitting: General Surgery

## 2013-01-15 VITALS — BP 132/78 | HR 72 | Temp 98.0°F | Resp 18 | Ht 62.0 in | Wt 149.0 lb

## 2013-01-15 DIAGNOSIS — D4862 Neoplasm of uncertain behavior of left breast: Secondary | ICD-10-CM

## 2013-01-15 DIAGNOSIS — N62 Hypertrophy of breast: Secondary | ICD-10-CM

## 2013-01-15 DIAGNOSIS — D4860 Neoplasm of uncertain behavior of unspecified breast: Secondary | ICD-10-CM

## 2013-01-15 DIAGNOSIS — N6099 Unspecified benign mammary dysplasia of unspecified breast: Secondary | ICD-10-CM | POA: Insufficient documentation

## 2013-01-15 NOTE — Patient Instructions (Signed)
We aspirated 250 cc of seroma fluid from your mastectomy wound and completely remove the fluid.  Wear a breast binder with some padding underneath  Return to see Dr. Derrell Lolling in one week. If the fluid reaccumulates we will put a small drain in to keep it evacuated.

## 2013-01-15 NOTE — Progress Notes (Signed)
Patient ID: [place] B Kader, female   DOB: Sep 11, 1945, 67 y.o.   MRN: [id number] This patient recently underwent left total mastectomy for recurrent atypical lobular hyperplasia. Her drain fell out prematurely and she has developed a fluid collection.  Exam: Patient looks well. No evidence of infection. Large fluid collection under mastectomy incision. This was prepped with Betadine and I aspirated 250 cc of clear serous fluid. She tolerated this well.  Plan: She will wear her breast binder with some padding underneath Return to see me in one week for 30 minute appointment. If the fluid re\re accumulates we will put a ceramic running.   Angelia Mould. Derrell Lolling, M.D., [facility] General and Minimally invasive Surgery Breast and Colorectal Surgery Office:   [phone] Pager:   [phone]

## 2013-01-17 ENCOUNTER — Telehealth (INDEPENDENT_AMBULATORY_CARE_PROVIDER_SITE_OTHER): Payer: Self-pay | Admitting: General Surgery

## 2013-01-17 ENCOUNTER — Observation Stay: Payer: Self-pay | Admitting: Internal Medicine

## 2013-01-17 LAB — CBC WITH DIFFERENTIAL/PLATELET
Basophil #: 0 10*3/uL (ref 0.0–0.1)
Eosinophil #: 0 10*3/uL (ref 0.0–0.7)
HCT: 37.4 % (ref 35.0–47.0)
Lymphocyte %: 15.6 %
MCV: 87 fL (ref 80–100)
Monocyte #: 1.3 x10 3/mm — ABNORMAL HIGH (ref 0.2–0.9)
Neutrophil %: 76 %
RBC: 4.31 10*6/uL (ref 3.80–5.20)

## 2013-01-17 LAB — COMPREHENSIVE METABOLIC PANEL
Albumin: 3.5 g/dL (ref 3.4–5.0)
Alkaline Phosphatase: 72 U/L (ref 50–136)
BUN: 9 mg/dL (ref 7–18)
Co2: 26 mmol/L (ref 21–32)
Creatinine: 1.28 mg/dL (ref 0.60–1.30)
EGFR (Non-African Amer.): 43 — ABNORMAL LOW
Glucose: 266 mg/dL — ABNORMAL HIGH (ref 65–99)
Osmolality: 280 (ref 275–301)
Potassium: 3.8 mmol/L (ref 3.5–5.1)
Sodium: 136 mmol/L (ref 136–145)
Total Protein: 6.8 g/dL (ref 6.4–8.2)

## 2013-01-17 LAB — URINALYSIS, COMPLETE
Bilirubin,UR: NEGATIVE
Blood: NEGATIVE
Nitrite: NEGATIVE
Protein: 30
RBC,UR: 2 /HPF (ref 0–5)
Squamous Epithelial: 2

## 2013-01-17 LAB — CK: CK, Total: 58 U/L (ref 21–215)

## 2013-01-17 LAB — TROPONIN I: Troponin-I: <0.02 ng/mL

## 2013-01-17 NOTE — Telephone Encounter (Signed)
Patient calling status post mastectomy on 01/01/2013. She is stating she is having flu like symptoms - body aches and chills that started yesterday. She is not running a fever. A little nausea but no vomiting. Her incisions are fine. No drainage or redness. I told her I would let Dr Derrell Lolling know but it does sound like she has some sort of virus and I encouraged her to see her PCP for evaluation. She will call with any other questions.

## 2013-01-17 NOTE — Telephone Encounter (Signed)
Let pt know Dr Derrell Lolling agrees.

## 2013-01-18 LAB — BASIC METABOLIC PANEL
Anion Gap: 8 (ref 7–16)
BUN: 8 mg/dL (ref 7–18)
Calcium, Total: 8.3 mg/dL — ABNORMAL LOW (ref 8.5–10.1)
Chloride: 105 mmol/L (ref 98–107)
Co2: 24 mmol/L (ref 21–32)
EGFR (African American): >60
EGFR (Non-African Amer.): >60
Glucose: 133 mg/dL — ABNORMAL HIGH (ref 65–99)
Sodium: 137 mmol/L (ref 136–145)

## 2013-01-18 LAB — CBC WITH DIFFERENTIAL/PLATELET
Basophil #: 0 10*3/uL (ref 0.0–0.1)
Basophil %: 0.3 %
HGB: 10.4 g/dL — ABNORMAL LOW (ref 12.0–16.0)
Lymphocyte %: 19.8 %
MCH: 29.3 pg (ref 26.0–34.0)
MCV: 86 fL (ref 80–100)
Monocyte #: 1.1 x10 3/mm — ABNORMAL HIGH (ref 0.2–0.9)
Neutrophil #: 8.3 10*3/uL — ABNORMAL HIGH (ref 1.4–6.5)
Neutrophil %: 69.5 %
Platelet: 236 10*3/uL (ref 150–440)
RBC: 3.54 10*6/uL — ABNORMAL LOW (ref 3.80–5.20)
WBC: 12 10*3/uL — ABNORMAL HIGH (ref 3.6–11.0)

## 2013-01-19 ENCOUNTER — Telehealth (INDEPENDENT_AMBULATORY_CARE_PROVIDER_SITE_OTHER): Payer: Self-pay | Admitting: General Surgery

## 2013-01-19 NOTE — Telephone Encounter (Signed)
Mr. Mcnicholas called to inform us that Anna Frank had been treated at [facility] for low blood pressure and fever.  She was started on oral antibiotics.

## 2013-01-21 ENCOUNTER — Telehealth (INDEPENDENT_AMBULATORY_CARE_PROVIDER_SITE_OTHER): Payer: Self-pay

## 2013-01-21 NOTE — Telephone Encounter (Signed)
Patient called to inform Anna Frank she was in  [facility] for low BP, Chills, Fever from 01-18-13 to 01-19-13. Patient states she is feeling better today; Denies fever, swelling in the incision site,drainage . Advised to keep her scheduled appt 01/25/13 with Dr. Derrell Lolling , to call if her condition changes or any concerns . Patient verbalized understanding

## 2013-01-22 LAB — CULTURE, BLOOD (SINGLE)

## 2013-01-24 ENCOUNTER — Ambulatory Visit (INDEPENDENT_AMBULATORY_CARE_PROVIDER_SITE_OTHER): Payer: Medicare Other | Admitting: General Surgery

## 2013-01-24 ENCOUNTER — Encounter (INDEPENDENT_AMBULATORY_CARE_PROVIDER_SITE_OTHER): Payer: Self-pay | Admitting: General Surgery

## 2013-01-24 VITALS — BP 128/71 | HR 87 | Temp 97.7°F | Ht 62.5 in | Wt 150.2 lb

## 2013-01-24 DIAGNOSIS — IMO0002 Reserved for concepts with insufficient information to code with codable children: Secondary | ICD-10-CM | POA: Insufficient documentation

## 2013-01-24 DIAGNOSIS — N62 Hypertrophy of breast: Secondary | ICD-10-CM

## 2013-01-24 DIAGNOSIS — Z5189 Encounter for other specified aftercare: Secondary | ICD-10-CM

## 2013-01-24 DIAGNOSIS — N6099 Unspecified benign mammary dysplasia of unspecified breast: Secondary | ICD-10-CM

## 2013-01-24 NOTE — Patient Instructions (Addendum)
We placed a small drain in your left mastectomy wound today. It is draining well.The fluid is clear and does not look infected.  Pay close attention to the drain and empty the bulb frequently.  Keep a written record of the drainage and bring that to the office with you  The drainage should slowly subside as the days ago by.  Keep the drainage site clean and dry. He will probably need to change the bandage a few times. Put some antibiotic ointment where the drain goes through the skin  Return to see Dr. Derrell Lolling in 2 weeks. Please call sooner if there are any problems.

## 2013-01-24 NOTE — Progress Notes (Signed)
Patient ID: Anna Frank, female   DOB: [birth year], 67 y.o.   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 She has a past history of right partial mastectomy , stem cell transplant, salvage mastectomy in 2007 with no evidence of recurrence.Her drain fell out prematurely and she developed a seroma. One week ago I aspirated 250 cc from the left mastectomy  wound. She returns today and  the fluid has returned ; she has a little bit of drainage from the wound.  She states she was hospitalized at [facility] several days ago for hypotension, 24-hour admission. Etiology was unclear. She was given a prescription for Augmentin in case there was an infectious etiology as she feels fine now    Exam: Patient looks well. No distress. Husband is with her. Left mastectomy wound shows no signs of infection but there is a large seroma. This area was prepped with chloro- prep. The seroma drain was placed in the lower mastectomy flap laterally. I evacuated at least 250 cc of clear, odorless, yellow tinged fluid. Drains sutured with nylon and connected to a suction bulb. The seroma was completely evacuated. This was dressed with antibiotic ointment. Tolerated well   Assessment: Large seroma left mastectomy wound due to premature dislodgment of drain History recent left mastectomy for recurrent atypical lobular neoplasia Remote history right breast cancer, no known recurrence   Plan: Wound care instructions given. Drain care structures given Return to see me in 2 weeks, sooner if there are any problems.   Angelia Mould. Derrell Lolling, M.D., [facility] General and Minimally invasive Surgery Breast and Colorectal Surgery Office:   [phone] Pager:   [phone]

## 2013-01-30 ENCOUNTER — Telehealth (INDEPENDENT_AMBULATORY_CARE_PROVIDER_SITE_OTHER): Payer: Self-pay

## 2013-01-30 NOTE — Telephone Encounter (Signed)
The pt called regarding her drain.  It has been putting out 10 cc yesterday and today.  I told her Dr Derrell Lolling would not want the drain to come out too early.  I asked her to give it another day and let me know tomorrow how much it is.  If it is still low or stops putting out I will check with a dr to see if I can remove it or not.  She sees Dr Derrell Lolling 7/24.  She asked again about an oncologist and I told her I can check with the ladies at the [facility] who they can reassign her with.

## 2013-01-31 NOTE — Telephone Encounter (Signed)
The pt called me and reports her drain put out 1 tsp yesterday and 1 tsp today.  She wants to know what we advise.  I spoke to Dr Luisa Hart and he advises the leave the drain in because it may fill back up with fluid.  I notified the pt to keep it in until next week.  I will call and check on her Monday.  She asked about the bandage and I advised her to keep the area clean and dry so change it if the drain site gets wet.  Her appointment next week is Thursday but we will see if Dr Derrell Lolling wants her in sooner.

## 2013-02-04 ENCOUNTER — Telehealth (INDEPENDENT_AMBULATORY_CARE_PROVIDER_SITE_OTHER): Payer: Self-pay

## 2013-02-04 ENCOUNTER — Other Ambulatory Visit (INDEPENDENT_AMBULATORY_CARE_PROVIDER_SITE_OTHER): Payer: Self-pay

## 2013-02-04 NOTE — Telephone Encounter (Signed)
Dr Derrell Lolling advised she needs to get a BP check and a CBC.  He doesn't want to remove the drain until he sees her.  He asked if her flaps have stuck down yet and I didn't know.  I called and notified the pt and she would like to get these done at her Medical Dr's office, Dr Judithann Sheen.  His nurse's name is French Ana.  Phone # is [phone].  I called their office and left a message to be sent to [facility] to arrange this for today.  They will call me back.  Then I will let the pt know when to go there.

## 2013-02-04 NOTE — Telephone Encounter (Signed)
Please call patient she has question about an appointment to be scheduled in [place]

## 2013-02-04 NOTE — Telephone Encounter (Signed)
I called Dr Judithann Sheen' office and they said she needs to see him at the same time.  She gave me an appointment for 9 am tomorrow.  I called and notified the pt.

## 2013-02-04 NOTE — Telephone Encounter (Signed)
I called the pt and checked on her.  She states she had a rough weekend.  She felt weak.  She felt as she did before when she went to the hospital.  She took some Augmentin and felt a little better.  She is glad she kept the drain in because she has emptied about 10 cc twice a day.  Her blood sugar has been ok.  She has not checked her BP.  It was low in the hospital.  I told her she may need to get a cuff and check it or have her md check it.  She saw her regular md last Tuesday and they didn't know what to tell her.  Everything checked ok.  She has never ran any fever.  She doesn't feel right and is worried.  She almost passed out in the grocery store.   I told her I will address it with Dr Derrell Lolling and see what he thinks of everything.  Dr Derrell Lolling was paged.

## 2013-02-06 ENCOUNTER — Telehealth (INDEPENDENT_AMBULATORY_CARE_PROVIDER_SITE_OTHER): Payer: Self-pay

## 2013-02-06 NOTE — Telephone Encounter (Signed)
I called the pt to let her know I need to move her appointment time up.  I want her to come in for an 0830 appointment tomorrow.  I await her return call.

## 2013-02-07 ENCOUNTER — Encounter (INDEPENDENT_AMBULATORY_CARE_PROVIDER_SITE_OTHER): Payer: Self-pay | Admitting: General Surgery

## 2013-02-07 ENCOUNTER — Ambulatory Visit (INDEPENDENT_AMBULATORY_CARE_PROVIDER_SITE_OTHER): Payer: Medicare Other | Admitting: General Surgery

## 2013-02-07 VITALS — BP 120/70 | HR 60 | Resp 14 | Ht 62.5 in | Wt 147.4 lb

## 2013-02-07 DIAGNOSIS — IMO0001 Reserved for inherently not codable concepts without codable children: Secondary | ICD-10-CM

## 2013-02-07 DIAGNOSIS — D4862 Neoplasm of uncertain behavior of left breast: Secondary | ICD-10-CM

## 2013-02-07 DIAGNOSIS — Z5189 Encounter for other specified aftercare: Secondary | ICD-10-CM

## 2013-02-07 DIAGNOSIS — D4860 Neoplasm of uncertain behavior of unspecified breast: Secondary | ICD-10-CM

## 2013-02-07 NOTE — Progress Notes (Signed)
Patient ID: Anna Frank, female   DOB: 06/01/1946, 67 y.o.   MRN: [id number] History: This patient returns for a left mastectomy wound check. We recently placed a seroma drain in the left side and she states that that has worked well and is now stopped draining. She feels pretty good  Exam: Patient looks well. Husband is with her. Left mastectomy wound is healing uneventfully. No infection or residual fluid. I removed the seroma drained.  Assessment:Large seroma left mastectomy wound due to premature dislodgment of drain  History recent left mastectomy for recurrent atypical lobular neoplasia  Remote history right breast cancer, no known recurrence   Plan: She will return to see me in 6 months Since her oncologist, Dr. Mariel Sleet has retired, she asked to be referred to medical oncologist in [place]. We will refer her to one of the breast oncologist.   Angelia Mould. Derrell Lolling, M.D., [facility] General and Minimally invasive Surgery Breast and Colorectal Surgery Office:   [phone] Pager:   [phone]

## 2013-02-07 NOTE — Patient Instructions (Signed)
Your left mastectomy wound is healing nicely. All of the fluid is gone. The drain was removed today.  You may increase range of motion exercises  left shoulder. You may shower starting tomorrow. You may drive your car. You may lift 25-30 pounds. Take a long walk everyday to increase your strength and endurance.  Return to see Dr. Derrell Lolling in 6 months, sooner if there are any problems.

## 2013-02-13 ENCOUNTER — Telehealth (INDEPENDENT_AMBULATORY_CARE_PROVIDER_SITE_OTHER): Payer: Self-pay

## 2013-02-13 NOTE — Telephone Encounter (Signed)
I called the pt back.  She states she has some fluid but not a lot.  It is starting to wiggle a little.  She has no redness or fever.  I told her about Dr Derrell Lolling being at the hospital this week but maybe he could come over and see her.  I told her he is not available next week.  I told her the body can reabsorb it and if not inflamed or painful she can give it a little time.  I told her to wear her pink wrap and to try warm compresses.  She will try that and call me back if she needs to.

## 2013-02-13 NOTE — Telephone Encounter (Signed)
Pt called office stating that she has fluid building up at her drain site (drain was pulled last week 7/24).  I offered pt an Urgent Office appt for today - pt states that she will not be able to come today.  Urgent Office appt for tomorrow.  Pt was please, until she heard that Dr. Derrell Lolling was not running Urgent Office.  Let pt know that Dr. Jacinto Halim first available is Sept 5th.  Pt stated that she needed to talk with Huntley Dec.  She does not feel comfortable seeing any one but Dr. Derrell Lolling.  I let the pt know that I will send a message to Huntley Dec to see if she can squeeze her in sooner.

## 2013-02-13 NOTE — Telephone Encounter (Signed)
Routing to Dr. Derrell Lolling and Huntley Dec

## 2013-02-25 ENCOUNTER — Telehealth (INDEPENDENT_AMBULATORY_CARE_PROVIDER_SITE_OTHER): Payer: Self-pay

## 2013-02-25 NOTE — Telephone Encounter (Signed)
Pt called to confirm appt for weds 02-27-13. Pt given time of appt and states she will keep this appt.

## 2013-02-25 NOTE — Telephone Encounter (Signed)
I called and left a message for pt to call.  I have an appointment for her on Wednesday at 3:45pm

## 2013-02-26 ENCOUNTER — Telehealth: Payer: Self-pay | Admitting: *Deleted

## 2013-02-26 NOTE — Telephone Encounter (Signed)
Left a few vm for pt to call back to schedule appt with Dr. Darnelle Catalan since Dr. Thornton Papas departure from [facility].  Will inform Dr. Jacinto Halim office of inability to reach pt.

## 2013-02-27 ENCOUNTER — Telehealth: Payer: Self-pay | Admitting: *Deleted

## 2013-02-27 ENCOUNTER — Ambulatory Visit (INDEPENDENT_AMBULATORY_CARE_PROVIDER_SITE_OTHER): Payer: Medicare Other | Admitting: General Surgery

## 2013-02-27 ENCOUNTER — Encounter (INDEPENDENT_AMBULATORY_CARE_PROVIDER_SITE_OTHER): Payer: Self-pay | Admitting: General Surgery

## 2013-02-27 VITALS — BP 110/80 | HR 72 | Resp 16 | Ht 62.5 in | Wt 146.2 lb

## 2013-02-27 DIAGNOSIS — Z5189 Encounter for other specified aftercare: Secondary | ICD-10-CM

## 2013-02-27 DIAGNOSIS — N62 Hypertrophy of breast: Secondary | ICD-10-CM

## 2013-02-27 DIAGNOSIS — IMO0001 Reserved for inherently not codable concepts without codable children: Secondary | ICD-10-CM

## 2013-02-27 DIAGNOSIS — N6099 Unspecified benign mammary dysplasia of unspecified breast: Secondary | ICD-10-CM

## 2013-02-27 NOTE — Progress Notes (Signed)
Patient ID: [place] B Rought, female   DOB: 02-01-1946, 67 y.o.   MRN: [id number] History: Patient returns for another postop check following her left mastectomy. She is feeling pretty good. No real pain or drainage. Dr. Mariel Sleet has retired, and she has an appointment to see Dr. Dr. Darnelle Catalan next week for long-term medical oncology followup  Exam: Patient looks well. Husband is with her Left mastectomy wound has healed. A little bit of redundant fatty tissue laterally. No infection. No fluid collection.  Assessment: Large seroma left mastectomy wound due to premature dislodgment drain, resolved following seroma drain placement History  left mastectomy for atypical lobular neoplasia, 01/01/2013 Remote history right breast cancer, multiple operations, no evidence of recurrence  Plan: At her request, she will see me in 6 months She will establish medical oncology with Dr. Kathryne Hitch M. Derrell Lolling, M.D., [facility] General and Minimally invasive Surgery Breast and Colorectal Surgery Office:   [phone] Pager:   [phone]

## 2013-02-27 NOTE — Telephone Encounter (Signed)
Pt received Dawn's voicemail and called to let us know that she does want to come here to [place] and see Dr. Darnelle Catalan.  Confirmed 05/14/13 appt w/ pt.  Mailed before appt letter & packet to pt.  Emailed Dr. Derrell Lolling at [facility] to make aware.  Told Dawn.  Took paperwork to Med Rec for chart.

## 2013-02-27 NOTE — Patient Instructions (Addendum)
Your left mastectomy wound is now healing without any obvious complications. There is no sign of any infection. There is no sign of any recurrent fluid collection.  You may begin to exercise without restrictions. Take a long walk every day. Take a yoga class to increase motion of your shoulder.  Keep your apt. With  Dr. Darnelle Catalan next week  Return to see Dr. Derrell Lolling in 6 months for a breast cancer check and surveillance.

## 2013-03-26 ENCOUNTER — Ambulatory Visit (HOSPITAL_COMMUNITY): Payer: Medicare Other

## 2013-05-14 ENCOUNTER — Ambulatory Visit: Payer: Medicare Other

## 2013-05-14 ENCOUNTER — Other Ambulatory Visit: Payer: Self-pay | Admitting: *Deleted

## 2013-05-14 ENCOUNTER — Other Ambulatory Visit (HOSPITAL_BASED_OUTPATIENT_CLINIC_OR_DEPARTMENT_OTHER): Payer: Medicare Other | Admitting: Lab

## 2013-05-14 ENCOUNTER — Ambulatory Visit (HOSPITAL_BASED_OUTPATIENT_CLINIC_OR_DEPARTMENT_OTHER): Payer: Medicare Other | Admitting: Oncology

## 2013-05-14 ENCOUNTER — Encounter: Payer: Self-pay | Admitting: Oncology

## 2013-05-14 VITALS — BP 159/86 | HR 85 | Temp 97.9°F | Resp 18 | Ht 62.5 in | Wt 150.0 lb

## 2013-05-14 DIAGNOSIS — Z853 Personal history of malignant neoplasm of breast: Secondary | ICD-10-CM

## 2013-05-14 DIAGNOSIS — Z8041 Family history of malignant neoplasm of ovary: Secondary | ICD-10-CM

## 2013-05-14 DIAGNOSIS — C50911 Malignant neoplasm of unspecified site of right female breast: Secondary | ICD-10-CM

## 2013-05-14 DIAGNOSIS — N6099 Unspecified benign mammary dysplasia of unspecified breast: Secondary | ICD-10-CM

## 2013-05-14 DIAGNOSIS — C50411 Malignant neoplasm of upper-outer quadrant of right female breast: Secondary | ICD-10-CM

## 2013-05-14 LAB — CBC WITH DIFFERENTIAL/PLATELET
Basophils Absolute: 0 10*3/uL (ref 0.0–0.1)
Eosinophils Absolute: 0.2 10*3/uL (ref 0.0–0.5)
HCT: 35.5 % (ref 34.8–46.6)
HGB: 11.3 g/dL — ABNORMAL LOW (ref 11.6–15.9)
LYMPH%: 34.2 % (ref 14.0–49.7)
MCH: 25.5 pg (ref 25.1–34.0)
MCV: 80 fL (ref 79.5–101.0)
MONO%: 7.8 % (ref 0.0–14.0)
NEUT#: 4.3 10*3/uL (ref 1.5–6.5)
NEUT%: 54.7 % (ref 38.4–76.8)
Platelets: 240 10*3/uL (ref 145–400)

## 2013-05-14 LAB — COMPREHENSIVE METABOLIC PANEL (CC13)
Albumin: 3.9 g/dL (ref 3.5–5.0)
Alkaline Phosphatase: 77 U/L (ref 40–150)
BUN: 10 mg/dL (ref 7.0–26.0)
Creatinine: 0.8 mg/dL (ref 0.6–1.1)
Glucose: 189 mg/dl — ABNORMAL HIGH (ref 70–140)
Potassium: 3.8 mEq/L (ref 3.5–5.1)
Total Bilirubin: 0.43 mg/dL (ref 0.20–1.20)

## 2013-05-14 NOTE — Progress Notes (Signed)
ID: [place] B Morua OB: 10-02-1945  MR#: [id number]  NWG#:[id number]  PCP: Marguarite Arbour, MD GYN:  Annamarie Major SU: Claud Kelp OTHER MD:  CHIEF COMPLAINT: "I lost my doctor."  HISTORY OF PRESENT ILLNESS: Anna Frank's history goes all the way back to 1998, when she had a right upper-outer quadrant breast biopsy for an estrogen receptor positive breast cancer. She underwent a right lumpectomy and axillary lymph node dissection under Francina Ames for a pT2 pN2, stage IIIA invasive lobular breast cancer, treated adjuvantly with high-dose chemotherapy and autologous transplantation at [facility]. She then received adjuvant radiation followed by adjuvant letrozole for 5 years.  In 2007 she was found to have a local recurrence and underwent simple right mastectomy 09/13/2005 for an mpT1b NX, stage IA invasive lobular breast cancer, grade 2, estrogen receptor 95% positive, progesterone receptor and HER-2 negative, with an MIB-1 of 19%. She was started on exemestane and this was continued on until March of 1994.  More recently she had multiple biopsies of the left breast, nonmalignant, leading to a final decision for left lumpectomy and x-ray lymph node sampling 01/01/2013 under Claud Kelp. This showed only atypical lobular hyperplasia.  The patient had been followed by Dr. Glenford Peers but Coryell left Anna Frank this year, she is seeking to establish yourself in my service.  INTERVAL HISTORY: Anna Frank was seen at the breast clinic 05/08/2013 accompanied by her husband Renae Fickle   REVIEW OF SYSTEMS: She has some seasonal allergies which would act up" this time of year. She describes herself is moderately fatigued. She has occasional pains "here and there", but these are very intermittent, and not more persistent or intense than before. She tells me her appetite is fair. Her diabetes is under good control. She says her blood pressure is generally also well-controlled. She sleeps on 2  pillows. She denies unusual headaches, visual changes, nausea, vomiting, dizziness, date imbalance or falls, pleurisy, hemoptysis, chest pain or pressure, or change in bowel or bladder habits. A detailed review of systems was otherwise noncontributory   PAST MEDICAL HISTORY: Past Medical History  Diagnosis Date  . 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 Hypertension   . PONV (postoperative nausea and vomiting)   . Hyperlipemia   . Family history of anesthesia complication     "one sister has PONV" (01/01/2013)  . Breast cancer 1998    right  . Breast cancer 01/01/2013    left  . Type II diabetes mellitus   . Stroke 1998    denies residual on 01/01/2013    PAST SURGICAL HISTORY: Past Surgical History  Procedure Laterality Date  . Mastectomy Right 2007  . Mastectomy complete / simple Left 01/01/2013  . Tonsillectomy  ~ 1954  . Breast lumpectomy  1998  . Breast biopsy Bilateral     "lots over the years" (01/01/2013)  . 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 Total mastectomy Left 01/01/2013    Procedure: TOTAL MASTECTOMY;  Surgeon: Ernestene Mention, MD;  Location: [facility] OR;  Service: General;  Laterality: Left;    FAMILY HISTORY Family History  Problem Relation Age of Onset  . Heart disease Mother   . Cancer Sister     ovarian  . Kidney cancer Sister   . Prostate cancer Paternal Uncle   . Prostate cancer Paternal Grandfather   . Breast cancer Cousin     4 paternal cousins with breast cancer in thier 63s  The patient's father died at the age of 26. The patient's mother died at the age of 68. The patient had one brother, 3 sisters. One sister was diagnosed with ovarian cancer at the age of 58. The patient's family history is well documented in our genetics counseling node from 07/04/2012, at which time testing did term and the patient did not carry a BRCA mutation  GYNECOLOGIC HISTORY:  Menarche age 50, the patient never carried a child to  term (she became postmenopausal with chemotherapy in 1998). She took birth control between 65 in 1980 with no complications. 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 Renae Fickle is retired from [employer].    ADVANCED DIRECTIVES: In place   HEALTH MAINTENANCE: History  Substance Use Topics  . Smoking status: Former Smoker -- 1.00 packs/day for 20 years    Types: Cigarettes    Quit date: 08/18/1996  . Smokeless tobacco: Never Used  . Alcohol Use: Yes     Comment: 01/01/2013 "4oz glass of wine when I drink; none for 3-4 weeks"     Colonoscopy: never  PAP:  Bone density: 04/12/2012, at [facility], normal  Lipid panel:  Allergies  Allergen Reactions  . Tryptophan Other (See Comments)    Affects bladder.    Current Outpatient Prescriptions  Medication Sig Dispense Refill  . aspirin 81 MG tablet Take 81 mg by mouth daily.      Marland Kitchen b complex vitamins tablet Take 1 tablet by mouth daily.      . fish oil-omega-3 fatty acids 1000 MG capsule Take 1 g by mouth daily.      Marland Kitchen HYDROcodone-acetaminophen (NORCO/VICODIN) 5-325 MG per tablet Take 1-2 tablets by mouth every 4 (four) hours as needed for pain.  40 tablet  1  . Liraglutide (VICTOZA [place]) Inject into the skin.      Marland Kitchen lisinopril (PRINIVIL,ZESTRIL) 10 MG tablet Take 10 mg by mouth daily.      . metFORMIN (GLUCOPHAGE) 1000 MG tablet Take 1,000 mg by mouth 2 (two) times daily with a meal.      . raloxifene (EVISTA) 60 MG tablet Take 60 mg by mouth daily.      . simvastatin (ZOCOR) 20 MG tablet Take 20 mg by mouth every evening.      . vitamin C (ASCORBIC ACID) 500 MG tablet Take 500 mg by mouth daily.      . vitamin E 1000 UNIT capsule Take 1,000 Units by mouth daily.      . fluticasone (FLONASE) 50 MCG/ACT nasal spray Place 2 sprays into the nose as needed for rhinitis.       No current facility-administered medications for this visit.    OBJECTIVE: Middle-aged  white woman in no acute distress  Filed Vitals:   05/14/13 1614  BP: 159/86  Pulse: 85  Temp: 97.9 F (36.6 C)  Resp: 18     Body mass index is 26.98 kg/(m^2).    ECOG FS:1 - Symptomatic but completely ambulatory  Ocular: Sclerae unicteric, pupils equal, round and reactive to light Ear-nose-throat: Oropharynx clear, dentition fair Lymphatic: No cervical or supraclavicular adenopathy Lungs no rales or rhonchi, good excursion bilaterally Heart regular rate and rhythm, no murmur appreciated Abd soft, nontender, positive bowel sounds MSK no focal spinal tenderness, no joint edema Neuro: non-focal, well-oriented, appropriate affect Breasts: Status post bilateral mastectomies. There is no evidence of local recurrence on the right. The  right axilla is benign. Left chest wall is unremarkable.   LAB RESULTS:  CMP     Component Value Date/Time   NA 139 05/14/2013 1603   NA 137 01/02/2013 0500   NA 139 12/03/2008 1046   K 3.8 05/14/2013 1603   K 3.8 01/02/2013 0500   K 4.0 12/03/2008 1046   CL 103 01/02/2013 0500   CL 101 12/03/2008 1046   CO2 22 05/14/2013 1603   CO2 25 01/02/2013 0500   CO2 29 12/03/2008 1046   GLUCOSE 189* 05/14/2013 1603   GLUCOSE 164* 01/02/2013 0500   GLUCOSE 169* 12/03/2008 1046   BUN 10.0 05/14/2013 1603   BUN 9 01/02/2013 0500   BUN 13 12/03/2008 1046   CREATININE 0.8 05/14/2013 1603   CREATININE 0.64 01/02/2013 0500   CREATININE 0.8 12/03/2008 1046   CALCIUM 9.4 05/14/2013 1603   CALCIUM 8.2* 01/02/2013 0500   CALCIUM 9.4 12/03/2008 1046   PROT 6.7 05/14/2013 1603   PROT 6.8 12/27/2012 1443   PROT 7.2 12/03/2008 1046   ALBUMIN 3.9 05/14/2013 1603   ALBUMIN 4.1 12/27/2012 1443   AST 23 05/14/2013 1603   AST 20 12/27/2012 1443   AST 28 12/03/2008 1046   ALT 25 05/14/2013 1603   ALT 18 12/27/2012 1443   ALT 29 12/03/2008 1046   ALKPHOS 77 05/14/2013 1603   ALKPHOS 74 12/27/2012 1443   ALKPHOS 61 12/03/2008 1046   BILITOT 0.43 05/14/2013 1603   BILITOT 0.5 12/27/2012  1443   BILITOT 0.90 12/03/2008 1046   GFRNONAA >90 01/02/2013 0500   GFRAA >90 01/02/2013 0500    I No results found for this basename: SPEP,  UPEP,   kappa and lambda light chains    Lab Results  Component Value Date   WBC 7.9 05/14/2013   NEUTROABS 4.3 05/14/2013   HGB 11.3* 05/14/2013   HCT 35.5 05/14/2013   MCV 80.0 05/14/2013   PLT 240 05/14/2013      Chemistry      Component Value Date/Time   NA 139 05/14/2013 1603   NA 137 01/02/2013 0500   NA 139 12/03/2008 1046   K 3.8 05/14/2013 1603   K 3.8 01/02/2013 0500   K 4.0 12/03/2008 1046   CL 103 01/02/2013 0500   CL 101 12/03/2008 1046   CO2 22 05/14/2013 1603   CO2 25 01/02/2013 0500   CO2 29 12/03/2008 1046   BUN 10.0 05/14/2013 1603   BUN 9 01/02/2013 0500   BUN 13 12/03/2008 1046   CREATININE 0.8 05/14/2013 1603   CREATININE 0.64 01/02/2013 0500   CREATININE 0.8 12/03/2008 1046      Component Value Date/Time   CALCIUM 9.4 05/14/2013 1603   CALCIUM 8.2* 01/02/2013 0500   CALCIUM 9.4 12/03/2008 1046   ALKPHOS 77 05/14/2013 1603   ALKPHOS 74 12/27/2012 1443   ALKPHOS 61 12/03/2008 1046   AST 23 05/14/2013 1603   AST 20 12/27/2012 1443   AST 28 12/03/2008 1046   ALT 25 05/14/2013 1603   ALT 18 12/27/2012 1443   ALT 29 12/03/2008 1046   BILITOT 0.43 05/14/2013 1603   BILITOT 0.5 12/27/2012 1443   BILITOT 0.90 12/03/2008 1046       Lab Results  Component Value Date   LABCA2 23 12/03/2008    No components found with this basename: [id number]    No results found for this basename: INR,  in the last 168 hours  Urinalysis    Component Value Date/Time  COLORURINE AMBER* 12/27/2012 1437   APPEARANCEUR CLEAR 12/27/2012 1437   LABSPEC 1.031* 12/27/2012 1437   PHURINE 5.0 12/27/2012 1437   GLUCOSEU NEGATIVE 12/27/2012 1437   HGBUR NEGATIVE 12/27/2012 1437   BILIRUBINUR NEGATIVE 12/27/2012 1437   KETONESUR 15* 12/27/2012 1437   PROTEINUR NEGATIVE 12/27/2012 1437   UROBILINOGEN 0.2 12/27/2012 1437   NITRITE NEGATIVE 12/27/2012  1437   LEUKOCYTESUR NEGATIVE 12/27/2012 1437    STUDIES: No results found.  ASSESSMENT: 67 y.o. BRCA negative [place], [place] woman  (1) s/p Right lumpectomy and ALND 1998 for a T2 N2, stage IIIA invasive lobular breast cancer, estrogen and progesterone receptor positive, treated with chemotherapy followed by autologous stem cell [facility] at [facility], followed by radiation, followed by letrozole x 5 years  (2) s/p Right simple mastectomy  09/13/2005 for an mpT1b invasive lobular breast cancer, grade 2, estrogen receptor 95% positive, progesterone receptor and HER-2 negative, with an Mib-1 of 19%, treated adjuvantly with exemestane for 6-7 years  (3) s/p Left lumpectomy and axillary node sampling 01/01/2013 for atypical lobular hyperplasia (no malignancy)  PLAN: We spent a lower an hour going over the [place] breast cancer diagnosis, treatment history, and prognosis. Do some up:  She is 16 years out from her stage III cancer, and 7 years out from her local recurrence. She has taken at least 11 years of aromatase inhibitor therapy. She understands we have little data for continuing these agents beyond 5 years much less 10 years, although I like the fact that she did receive antiestrogen therapy for 10 years. I see very little point however in her taking raloxifene. She had a stroke, although fortunately with very little residual, and raloxifene can cause clotting complications like tamoxifen. She also has a normal bone density.   Accordingly my recommendation was that she stop the raloxifene and we initiated observation. My concern is that she has not had a colonoscopy ever, and that she has a sister with ovarian cancer. There is her was very reluctant to consider a colonoscopy, but since there is now a test that may allow her to be screened simply by taking DNA in the stool, she would like to discuss this possibility with Dr. Pete Pelt (who has done Paul's colonoscopies) and we have put in that  referral.  We'll so discuss bilateral salpingo-oophorectomy. She understands she does not need a hysterectomy. We have a gynecological oncologist in [place] with, Neal Dy, and I offered to make the referral and but [place] would like to wait on that for now.  She will see me again in 6 months. She knows to call for any problems that may develop before that visit.   Lowella Dell, MD   05/15/2013 5:39 PM

## 2013-05-14 NOTE — Progress Notes (Signed)
Checked in new pt with no financial concerns. °

## 2013-05-15 DIAGNOSIS — C50411 Malignant neoplasm of upper-outer quadrant of right female breast: Secondary | ICD-10-CM | POA: Insufficient documentation

## 2013-05-17 ENCOUNTER — Telehealth: Payer: Self-pay | Admitting: Oncology

## 2013-05-17 NOTE — Telephone Encounter (Signed)
[facility] adv of 04/15 appts and I would call back once referral made to Dr. Matthias Hughs shh

## 2013-05-17 NOTE — Telephone Encounter (Signed)
Called Dr. Rogers Seeds off at [phone] for GI referal as per 10/28 POF. SW Britta Mccreedy who adv it will be end of Nov before Dr Rogers Seeds Vanessa Kick schedule is available.  I made a follow up to contact and schedule then. [facility]

## 2013-05-28 ENCOUNTER — Encounter: Payer: Self-pay | Admitting: *Deleted

## 2013-05-28 NOTE — Progress Notes (Signed)
Mailed after appt letter to pt. 

## 2013-06-10 ENCOUNTER — Telehealth (INDEPENDENT_AMBULATORY_CARE_PROVIDER_SITE_OTHER): Payer: Self-pay

## 2013-06-10 NOTE — Telephone Encounter (Signed)
I called the pt and gave her the recommendations from Dr Derrell Lolling.  She asked my opinion if I knew any of them.  I told her Dr Vincente Poli is very nice.  She will call her office and see if she can be seen.  I advised she may need to get her records from Dr Tiburcio Pea' office.

## 2013-06-10 NOTE — Telephone Encounter (Signed)
The pt called to report Dr Darnelle Catalan recommends she have her ovaries removed.  She has seen her Gynecologist Dr Tiburcio Pea at [facility] in [place].  She is not pleased to have him do the surgery.  Especially because he will use [facility] which has a "D" rating.  She wants to know who Dr Derrell Lolling would recommend here in [place] to do laparoscopic removal of ovaries.

## 2013-06-10 NOTE — Telephone Encounter (Signed)
Lots of good gynecologic surgeons in [place] that can do this.  [place], [place], Michelle [place]. Richard Arlyce Dice, Aram Bee[place]am [place], Noland [place].  hmi

## 2013-06-17 ENCOUNTER — Telehealth: Payer: Self-pay | Admitting: Oncology

## 2013-06-17 NOTE — Telephone Encounter (Signed)
tried to sche o1/15 appt w Dr Matthias Hughs as per Dr M's POF however this office does not do new stool DNA tests which  Dr Judie Petit had indicated interest in for this pt.Marland KitchenMarland KitchenEmail sent to Dr. Judie Petit asking for direction for this pt shh

## 2013-06-21 ENCOUNTER — Telehealth: Payer: Self-pay | Admitting: Oncology

## 2013-06-21 NOTE — Telephone Encounter (Signed)
LVMM advising pt appt made w [facility] per GM Jan cal mailed to pt and requ for med recs to be faxed taken to [facility] shh

## 2013-06-21 NOTE — Telephone Encounter (Signed)
correction to previous 12/5 entry NO JAN CAL mailed to pt This was typed in error shh

## 2013-06-24 ENCOUNTER — Telehealth: Payer: Self-pay | Admitting: Oncology

## 2013-06-24 NOTE — Telephone Encounter (Signed)
RECORDS FAXED TO [facility] @ [phone]

## 2013-07-03 ENCOUNTER — Encounter (INDEPENDENT_AMBULATORY_CARE_PROVIDER_SITE_OTHER): Payer: Self-pay | Admitting: General Surgery

## 2013-07-03 ENCOUNTER — Ambulatory Visit (INDEPENDENT_AMBULATORY_CARE_PROVIDER_SITE_OTHER): Payer: Medicare Other | Admitting: General Surgery

## 2013-07-03 VITALS — BP 122/74 | HR 80 | Temp 97.9°F | Resp 18 | Ht 62.0 in | Wt 145.8 lb

## 2013-07-03 DIAGNOSIS — Z853 Personal history of malignant neoplasm of breast: Secondary | ICD-10-CM

## 2013-07-03 DIAGNOSIS — N6099 Unspecified benign mammary dysplasia of unspecified breast: Secondary | ICD-10-CM

## 2013-07-03 DIAGNOSIS — C50411 Malignant neoplasm of upper-outer quadrant of right female breast: Secondary | ICD-10-CM

## 2013-07-03 DIAGNOSIS — N62 Hypertrophy of breast: Secondary | ICD-10-CM

## 2013-07-03 NOTE — Patient Instructions (Signed)
Examination of your bilateral mastectomy wounds and  lymph node areas is normal. There is no evidence of cancer on physical exam.  Be sure to keep your appointment with Dr. Darnelle Catalan.  Be sure to set up a colonoscopy with Dr. Matthias Hughs  We will make a referral to gynecology for GYN exam and consideration of laparoscopic bilateral salpingo-oophorectomy  Return to see Dr. Derrell Lolling in one year.

## 2013-07-03 NOTE — Progress Notes (Signed)
Patient ID: Anna Frank, female   DOB: 1946/03/30, 67 y.o.   MRN: [id number]  History: Anna Frank returns for interval followup. History is significant for right breast cancer with multiple surgeries in the past including stem cell transplant and ultimate mastectomy without known recurrence to date. 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 She had a postop seroma which is now resolved following seroma drain. She is now established  with Dr. Darnelle Catalan. He has changed some of her medications around. He has referred her for colonoscopy and she is going to see Dr. Matthias Hughs,   He has also advised her to have her ovaries removed and we have given her some names of gynecologist to do laparoscopic oophorectomy. We are going to go ahead make that referral. Her only surgical issue is a little bit of skin redundancy of the left flank. She has no pain. She is fully active. Has full range of motion of her shoulders  Exam:  The patient looks well. No distress. We spent a long time talking Lymph nodes in the neck and axillae are negative Bilateral mastectomy was well-healed. No nodules or ulcerations. Little bit of redundant skin and fatty tissue laterally but nothing excessive. No tenderness. No fluid collections  Assessment: large seroma left mastectomy wound due to premature dislodgment of drain, now resolved. Excellent healing History left mastectomy for recurrent atypical lobular neoplasia, 01/01/2013 Remote history right breast cancer, multiple operations, no evidence of recurrence  Plan: At her request she will see me in one year She'll continue followup Dr. Darnelle Catalan every 6 months Colonoscopy with Dr. Matthias Hughs in the future Ambulatory referral to gynecology for GYN exam and hopefully, a laparoscopic BSO   Angelia Mould. Derrell Lolling, M.D., [facility] General and Minimally invasive Surgery Breast and Colorectal Surgery Office:   [phone] Pager:   [phone]

## 2013-07-05 ENCOUNTER — Telehealth (INDEPENDENT_AMBULATORY_CARE_PROVIDER_SITE_OTHER): Payer: Self-pay | Admitting: *Deleted

## 2013-07-05 NOTE — Telephone Encounter (Signed)
I spoke with pts husband Renae Fickle and informed him of pts appt with Dr. Vincente Poli at [facility] on 07/24/13 with an arrival time of 2:45pm.  Instructed him that pt is to take her insurance card, photo id, and a list of all medications.  Also instructed him that pt would need to contact her current OBGYN and have records sent over to Dr. Vincente Poli.  I provided Renae Fickle with their address of [address] and phone number of [phone].  He is agreeable and I informed him to have pt call out office if she has any further questions.

## 2013-07-06 IMAGING — CR DG CHEST 1V PORT
1 series · 1 of 1 positions shown · non-contrast
Comparison: none

REASON FOR EXAM: sepsis
COMMENTS:

[ap]
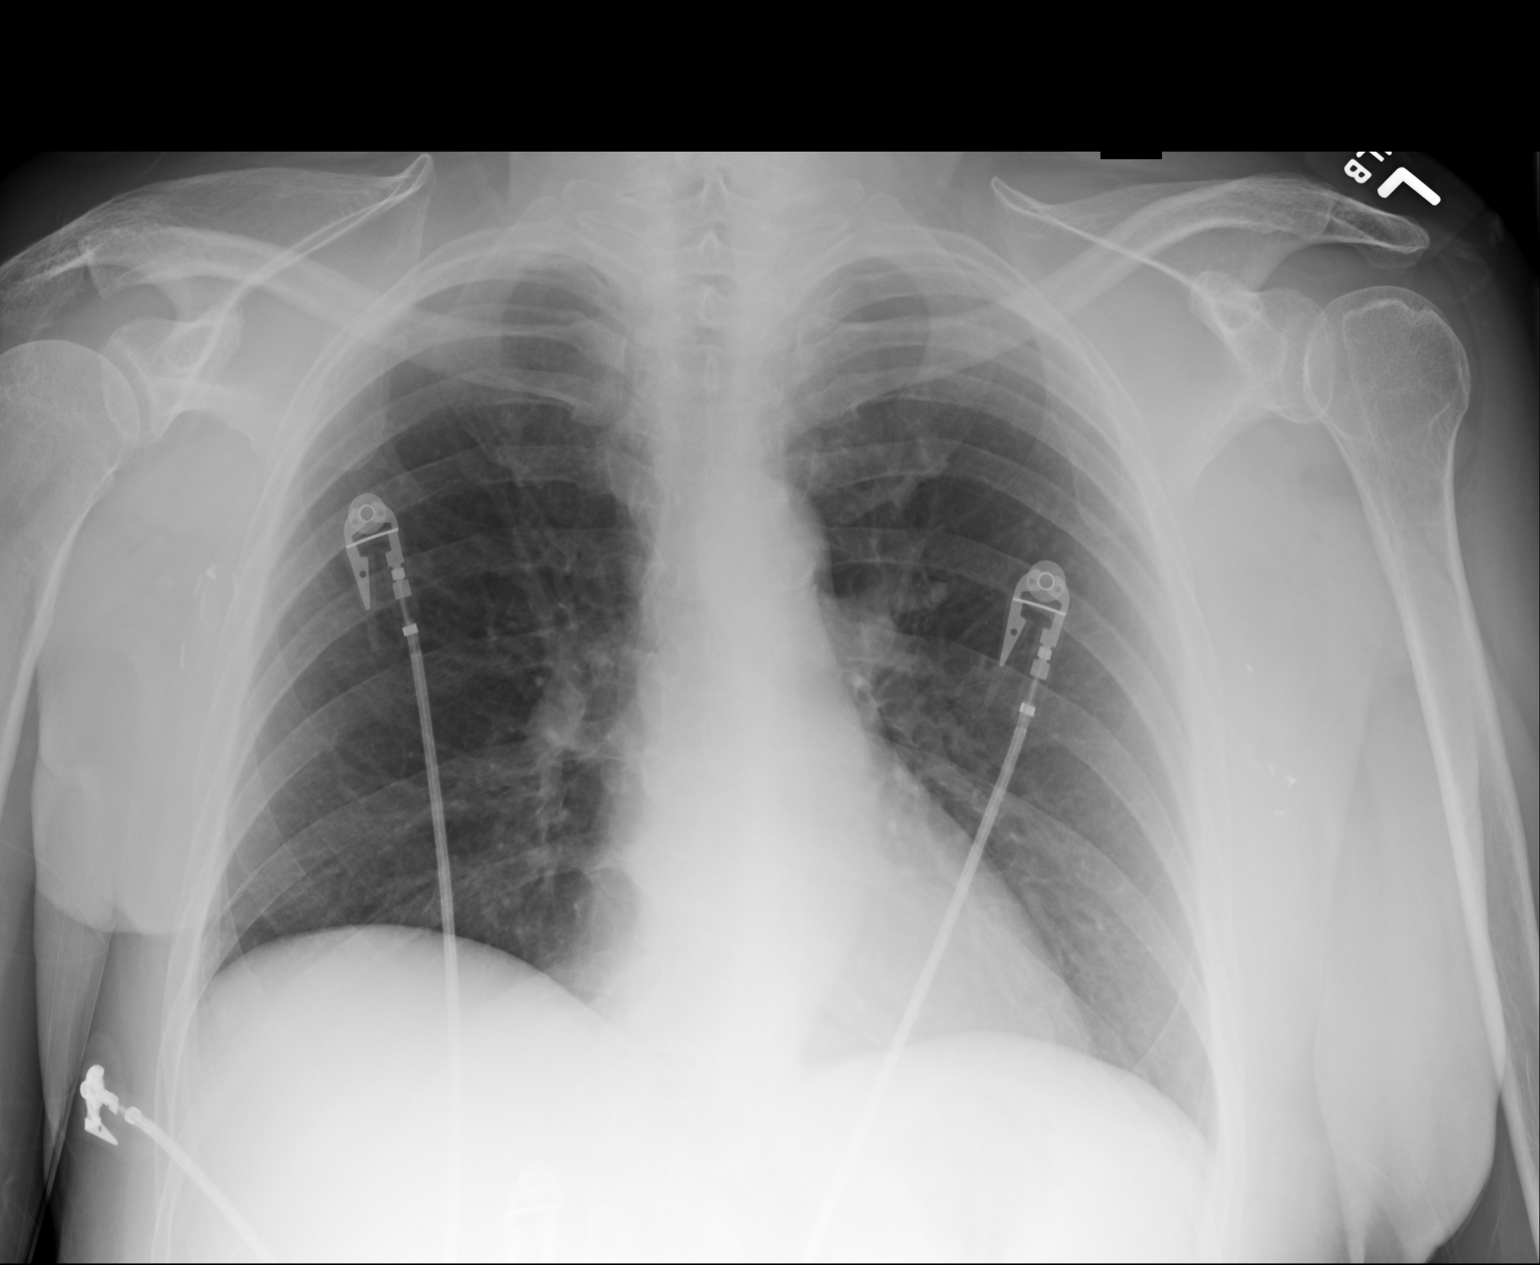

[1 of 1 positions shown; findings below may reference images not displayed]

PROCEDURE:     DXR - DXR PORTABLE CHEST SINGLE VIEW  - January 17, 2013  [DATE]

RESULT:     The lungs are adequately inflated and clear. The cardiac
silhouette is normal in size. The mediastinum is normal in width. The
pulmonary vascularity is not engorged. There is tortuosity of the descending
thoracic aorta.
IMPRESSION: There is no evidence of acute cardiopulmonary abnormality.

[REDACTED]

## 2013-09-04 ENCOUNTER — Other Ambulatory Visit: Payer: Self-pay | Admitting: Gastroenterology

## 2013-09-10 NOTE — H&P (Signed)
68 year old G 0 presents for Laparoscopic BSO.  She has a history of Breast cancer - diagnosed twice in 1998 initially. She had a double mastectomy in 2007 and 2014.  She has had a stem cell transplant performed at [facility].  She wants a Laparoscopic BSO because her sister also has had ovarian cancer.  She is negative for the BRCA-1 and BRCA-2 mutation.  Medical history is significant for stroke in 1998.  Medications See list  NKDA  Afebrile VSS General alert and oriented  Lung CTAB Car RRR Abdomen is soft and non tender Ultrasound in my office - positive for fibroids with normal adnexae  IMPRESSION: History of breast Cancer  PLAN: Desires prophylactic BSO Plan laparoscopic BSO with washings. She chose to not have a hysterectomy at this time. Advised to hold her aspirin and fish oil one week prior to surgery. Risks reviewed Consent signed

## 2013-09-12 ENCOUNTER — Other Ambulatory Visit (HOSPITAL_COMMUNITY): Payer: Medicare Other

## 2013-09-17 ENCOUNTER — Encounter (HOSPITAL_COMMUNITY): Payer: Self-pay

## 2013-09-17 ENCOUNTER — Encounter (HOSPITAL_COMMUNITY)
Admission: RE | Admit: 2013-09-17 | Discharge: 2013-09-17 | Disposition: A | Discharge status: Home / Self Care | Payer: Medicare Other | Source: Ambulatory Visit | Attending: Obstetrics and Gynecology | Admitting: Obstetrics and Gynecology

## 2013-09-17 LAB — CBC
HCT: 37.1 % (ref 36.0–46.0)
Hemoglobin: 11.9 g/dL — ABNORMAL LOW (ref 12.0–15.0)
MCH: 25.3 pg — ABNORMAL LOW (ref 26.0–34.0)
MCHC: 32.1 g/dL (ref 30.0–36.0)
MCV: 78.9 fL (ref 78.0–100.0)
PLATELETS: 262 10*3/uL (ref 150–400)
RBC: 4.7 MIL/uL (ref 3.87–5.11)
RDW: 14.6 % (ref 11.5–15.5)
WBC: 6.7 10*3/uL (ref 4.0–10.5)

## 2013-09-17 LAB — COMPREHENSIVE METABOLIC PANEL
ALBUMIN: 4.4 g/dL (ref 3.5–5.2)
ALK PHOS: 61 U/L (ref 39–117)
ALT: 20 U/L (ref 0–35)
AST: 22 U/L (ref 0–37)
BILIRUBIN TOTAL: 0.5 mg/dL (ref 0.3–1.2)
BUN: 8 mg/dL (ref 6–23)
CHLORIDE: 98 meq/L (ref 96–112)
CO2: 28 meq/L (ref 19–32)
Calcium: 9.6 mg/dL (ref 8.4–10.5)
Creatinine, Ser: 0.61 mg/dL (ref 0.50–1.10)
GFR calc Af Amer: >90 mL/min (ref >90)
Glucose, Bld: 152 mg/dL — ABNORMAL HIGH (ref 70–99)
POTASSIUM: 3.9 meq/L (ref 3.7–5.3)
SODIUM: 139 meq/L (ref 137–147)
Total Protein: 7.2 g/dL (ref 6.0–8.3)

## 2013-09-17 LAB — GLUCOSE, CAPILLARY: Glucose-Capillary: 140 mg/dL — ABNORMAL HIGH (ref 70–99)

## 2013-09-17 NOTE — Patient Instructions (Signed)
20 [name] B Spinello  09/17/2013   Your procedure is scheduled on:  09/19/13  Enter through the Main Entrance of [facility] [place] up the phone at the desk and dial [phone].   Call this number if you have problems the morning of surgery: [phone]   Remember:   Do not eat food:After Midnight.  Do not drink clear liquids: After Midnight.  Take these medicines the morning of surgery with A SIP OF WATER: Lisinopril  Hold Metformin 24hrs   Do not wear jewelry, make-up or nail polish.  Do not wear lotions, powders, or perfumes. You may wear deodorant.  Do not shave 48 hours prior to surgery.  Do not bring valuables to the hospital.  [facility] is not   responsible for any belongings or valuables brought to the hospital.  Contacts, dentures or bridgework may not be worn into surgery.  Leave suitcase in the car. After surgery it may be brought to your room.  For patients admitted to the hospital, checkout time is 11:00 AM the day of              discharge.   Patients discharged the day of surgery will not be allowed to drive             home.  Name and phone number of your driver: husband  Eddie Dibbles  Special Instructions:      Please read over the following fact sheets that you were given:   Surgical Site Infection Prevention

## 2013-09-19 ENCOUNTER — Encounter (HOSPITAL_COMMUNITY): Payer: Self-pay | Admitting: Anesthesiology

## 2013-09-19 ENCOUNTER — Ambulatory Visit (HOSPITAL_COMMUNITY): Payer: Medicare Other | Admitting: Anesthesiology

## 2013-09-19 ENCOUNTER — Encounter (HOSPITAL_COMMUNITY): Payer: Medicare Other | Admitting: Anesthesiology

## 2013-09-19 ENCOUNTER — Encounter (HOSPITAL_COMMUNITY)
Admission: RE | Disposition: A | Discharge status: Home / Self Care | Payer: Self-pay | Source: Ambulatory Visit | Attending: Obstetrics and Gynecology

## 2013-09-19 ENCOUNTER — Ambulatory Visit (HOSPITAL_COMMUNITY)
Admission: RE | Admit: 2013-09-19 | Discharge: 2013-09-19 | Disposition: A | Discharge status: Home / Self Care | Payer: Medicare Other | Source: Ambulatory Visit | Attending: Obstetrics and Gynecology | Admitting: Obstetrics and Gynecology

## 2013-09-19 DIAGNOSIS — N6099 Unspecified benign mammary dysplasia of unspecified breast: Secondary | ICD-10-CM

## 2013-09-19 DIAGNOSIS — C50411 Malignant neoplasm of upper-outer quadrant of right female breast: Secondary | ICD-10-CM

## 2013-09-19 DIAGNOSIS — Z4002 Encounter for prophylactic removal of ovary: Secondary | ICD-10-CM | POA: Diagnosis not present

## 2013-09-19 DIAGNOSIS — E119 Type 2 diabetes mellitus without complications: Secondary | ICD-10-CM | POA: Insufficient documentation

## 2013-09-19 DIAGNOSIS — Z8041 Family history of malignant neoplasm of ovary: Secondary | ICD-10-CM | POA: Insufficient documentation

## 2013-09-19 DIAGNOSIS — I10 Essential (primary) hypertension: Secondary | ICD-10-CM | POA: Insufficient documentation

## 2013-09-19 DIAGNOSIS — N9489 Other specified conditions associated with female genital organs and menstrual cycle: Secondary | ICD-10-CM | POA: Insufficient documentation

## 2013-09-19 DIAGNOSIS — N838 Other noninflammatory disorders of ovary, fallopian tube and broad ligament: Secondary | ICD-10-CM | POA: Insufficient documentation

## 2013-09-19 DIAGNOSIS — Z8673 Personal history of transient ischemic attack (TIA), and cerebral infarction without residual deficits: Secondary | ICD-10-CM | POA: Insufficient documentation

## 2013-09-19 DIAGNOSIS — Z853 Personal history of malignant neoplasm of breast: Secondary | ICD-10-CM | POA: Insufficient documentation

## 2013-09-19 DIAGNOSIS — Z87891 Personal history of nicotine dependence: Secondary | ICD-10-CM | POA: Insufficient documentation

## 2013-09-19 DIAGNOSIS — IMO0002 Reserved for concepts with insufficient information to code with codable children: Secondary | ICD-10-CM

## 2013-09-19 HISTORY — PX: LAPAROSCOPY: SHX197

## 2013-09-19 LAB — GLUCOSE, CAPILLARY
Glucose-Capillary: 162 mg/dL — ABNORMAL HIGH (ref 70–99)
Glucose-Capillary: 163 mg/dL — ABNORMAL HIGH (ref 70–99)

## 2013-09-19 SURGERY — LAPAROSCOPY OPERATIVE
Anesthesia: General | Site: Abdomen

## 2013-09-19 MED ORDER — MIDAZOLAM HCL 5 MG/5ML IJ SOLN
INTRAMUSCULAR | Status: DC | PRN
  Administered 2013-09-19: 2 mg via INTRAVENOUS

## 2013-09-19 MED ORDER — HEPARIN SODIUM (PORCINE) 5000 UNIT/ML IJ SOLN
INTRAMUSCULAR | Status: AC
Start: 2013-09-19 — End: 2013-09-19
  Filled 2013-09-19: qty 1

## 2013-09-19 MED ORDER — NEOSTIGMINE METHYLSULFATE 1 MG/ML IJ SOLN
INTRAMUSCULAR | Status: AC
  Filled 2013-09-19: qty 1

## 2013-09-19 MED ORDER — PROPOFOL 10 MG/ML IV BOLUS
INTRAVENOUS | Status: DC | PRN
  Administered 2013-09-19: 130 mg via INTRAVENOUS

## 2013-09-19 MED ORDER — MEPERIDINE HCL 25 MG/ML IJ SOLN: 6.2500 mg | INTRAMUSCULAR | Status: DC | PRN

## 2013-09-19 MED ORDER — KETOROLAC TROMETHAMINE 30 MG/ML IJ SOLN
INTRAMUSCULAR | Status: DC | PRN
  Administered 2013-09-19: 15 mg via INTRAVENOUS

## 2013-09-19 MED ORDER — ONDANSETRON HCL 4 MG/2ML IJ SOLN
INTRAMUSCULAR | Status: DC | PRN
  Administered 2013-09-19: 4 mg via INTRAVENOUS

## 2013-09-19 MED ORDER — NEOSTIGMINE METHYLSULFATE 1 MG/ML IJ SOLN
INTRAMUSCULAR | Status: DC | PRN
  Administered 2013-09-19: 3 mg via INTRAVENOUS

## 2013-09-19 MED ORDER — FENTANYL CITRATE 0.05 MG/ML IJ SOLN: 25.0000 ug | INTRAMUSCULAR | Status: DC | PRN

## 2013-09-19 MED ORDER — LACTATED RINGERS IV SOLN
INTRAVENOUS | Status: DC
  Administered 2013-09-19: 08:00:00 via INTRAVENOUS
  Administered 2013-09-19: 1000 mL via INTRAVENOUS

## 2013-09-19 MED ORDER — CEFAZOLIN SODIUM-DEXTROSE 2-3 GM-% IV SOLR
INTRAVENOUS | Status: AC
  Administered 2013-09-19: 2 g via INTRAVENOUS
  Filled 2013-09-19: qty 50

## 2013-09-19 MED ORDER — LACTATED RINGERS IR SOLN
Status: DC | PRN
  Administered 2013-09-19: 3000 mL

## 2013-09-19 MED ORDER — ROCURONIUM BROMIDE 100 MG/10ML IV SOLN
INTRAVENOUS | Status: DC | PRN
  Administered 2013-09-19: 30 mg via INTRAVENOUS

## 2013-09-19 MED ORDER — KETOROLAC TROMETHAMINE 30 MG/ML IJ SOLN: 15.0000 mg | Freq: Once | INTRAMUSCULAR | Status: DC | PRN

## 2013-09-19 MED ORDER — KETOROLAC TROMETHAMINE 30 MG/ML IJ SOLN
INTRAMUSCULAR | Status: AC
  Filled 2013-09-19: qty 1

## 2013-09-19 MED ORDER — ONDANSETRON HCL 4 MG/2ML IJ SOLN
INTRAMUSCULAR | Status: AC
  Filled 2013-09-19: qty 2

## 2013-09-19 MED ORDER — FENTANYL CITRATE 0.05 MG/ML IJ SOLN
INTRAMUSCULAR | Status: DC | PRN
  Administered 2013-09-19: 150 ug via INTRAVENOUS
  Administered 2013-09-19: 100 ug via INTRAVENOUS

## 2013-09-19 MED ORDER — PROPOFOL 10 MG/ML IV EMUL
INTRAVENOUS | Status: AC
  Filled 2013-09-19: qty 20

## 2013-09-19 MED ORDER — EPHEDRINE SULFATE 50 MG/ML IJ SOLN
INTRAMUSCULAR | Status: DC | PRN
  Administered 2013-09-19: 10 mg via INTRAVENOUS

## 2013-09-19 MED ORDER — ONDANSETRON HCL 4 MG/2ML IJ SOLN: 4.0000 mg | Freq: Once | INTRAMUSCULAR | Status: DC | PRN

## 2013-09-19 MED ORDER — SODIUM CHLORIDE 0.9 % IJ SOLN
INTRAMUSCULAR | Status: AC
  Filled 2013-09-19: qty 10

## 2013-09-19 MED ORDER — MIDAZOLAM HCL 2 MG/2ML IJ SOLN
INTRAMUSCULAR | Status: AC
  Filled 2013-09-19: qty 2

## 2013-09-19 MED ORDER — FENTANYL CITRATE 0.05 MG/ML IJ SOLN
INTRAMUSCULAR | Status: AC
  Filled 2013-09-19: qty 5

## 2013-09-19 MED ORDER — DEXAMETHASONE SODIUM PHOSPHATE 10 MG/ML IJ SOLN
INTRAMUSCULAR | Status: AC
  Filled 2013-09-19: qty 1

## 2013-09-19 MED ORDER — BUPIVACAINE HCL (PF) 0.25 % IJ SOLN
INTRAMUSCULAR | Status: DC | PRN
  Administered 2013-09-19: 4 mL

## 2013-09-19 MED ORDER — SODIUM CHLORIDE 0.9 % IJ SOLN
INTRAMUSCULAR | Status: DC | PRN
  Administered 2013-09-19: 3 mL

## 2013-09-19 MED ORDER — BUPIVACAINE HCL (PF) 0.25 % IJ SOLN
INTRAMUSCULAR | Status: AC
  Filled 2013-09-19: qty 30

## 2013-09-19 MED ORDER — HYDROCODONE-ACETAMINOPHEN 10-325 MG PO TABS: 1.0000 | ORAL_TABLET | Freq: Four times a day (QID) | ORAL | Status: AC | PRN

## 2013-09-19 MED ORDER — EPHEDRINE 5 MG/ML INJ
INTRAVENOUS | Status: AC
  Filled 2013-09-19: qty 10

## 2013-09-19 MED ORDER — LIDOCAINE HCL (CARDIAC) 20 MG/ML IV SOLN
INTRAVENOUS | Status: DC | PRN
  Administered 2013-09-19: 50 mg via INTRAVENOUS

## 2013-09-19 MED ORDER — LIDOCAINE HCL (CARDIAC) 20 MG/ML IV SOLN
INTRAVENOUS | Status: AC
  Filled 2013-09-19: qty 5

## 2013-09-19 MED ORDER — GLYCOPYRROLATE 0.2 MG/ML IJ SOLN
INTRAMUSCULAR | Status: AC
  Filled 2013-09-19: qty 4

## 2013-09-19 MED ORDER — GLYCOPYRROLATE 0.2 MG/ML IJ SOLN
INTRAMUSCULAR | Status: DC | PRN
  Administered 2013-09-19: 0.4 mg via INTRAVENOUS

## 2013-09-19 MED ORDER — CEFAZOLIN SODIUM-DEXTROSE 2-3 GM-% IV SOLR: 2.0000 g | INTRAVENOUS | Status: DC

## 2013-09-19 MED ORDER — DEXAMETHASONE SODIUM PHOSPHATE 10 MG/ML IJ SOLN
INTRAMUSCULAR | Status: DC | PRN
  Administered 2013-09-19: 10 mg via INTRAVENOUS

## 2013-09-19 SURGICAL SUPPLY — 28 items
ADH SKN CLS APL DERMABOND .7 (GAUZE/BANDAGES/DRESSINGS) ×1
BAG SPEC RTRVL LRG 6X4 10 (ENDOMECHANICALS)
CABLE HIGH FREQUENCY MONO STRZ (ELECTRODE) IMPLANT
CATH ROBINSON RED A/P 16FR (CATHETERS) ×3 IMPLANT
CHLORAPREP W/TINT 26ML (MISCELLANEOUS) ×3 IMPLANT
CLOTH BEACON ORANGE TIMEOUT ST (SAFETY) ×3 IMPLANT
DERMABOND ADVANCED (GAUZE/BANDAGES/DRESSINGS) ×2
DERMABOND ADVANCED .7 DNX12 (GAUZE/BANDAGES/DRESSINGS) IMPLANT
EVACUATOR SMOKE 8.L (FILTER) IMPLANT
GLOVE BIO SURGEON STRL SZ 6.5 (GLOVE) ×2 IMPLANT
GLOVE BIO SURGEONS STRL SZ 6.5 (GLOVE) ×1
GOWN STRL REUS W/TWL LRG LVL3 (GOWN DISPOSABLE) ×6 IMPLANT
NEEDLE INSUFFLATION 120MM (ENDOMECHANICALS) ×3 IMPLANT
NS IRRIG 1000ML POUR BTL (IV SOLUTION) ×3 IMPLANT
PACK LAPAROSCOPY BASIN (CUSTOM PROCEDURE TRAY) ×3 IMPLANT
POUCH SPECIMEN RETRIEVAL 10MM (ENDOMECHANICALS) IMPLANT
PROTECTOR NERVE ULNAR (MISCELLANEOUS) ×3 IMPLANT
SEALER TISSUE G2 CVD JAW 35 (ENDOMECHANICALS) IMPLANT
SEALER TISSUE G2 CVD JAW 45CM (ENDOMECHANICALS) ×2 IMPLANT
SET IRRIG TUBING LAPAROSCOPIC (IRRIGATION / IRRIGATOR) ×2 IMPLANT
SUT VIC AB 3-0 PS2 18 (SUTURE) ×3
SUT VIC AB 3-0 PS2 18XBRD (SUTURE) ×1 IMPLANT
SUT VICRYL 0 UR6 27IN ABS (SUTURE) ×3 IMPLANT
TOWEL OR 17X24 6PK STRL BLUE (TOWEL DISPOSABLE) ×6 IMPLANT
TROCAR OPTI TIP 5M 100M (ENDOMECHANICALS) IMPLANT
TROCAR XCEL DIL TIP R 11M (ENDOMECHANICALS) ×6 IMPLANT
WARMER LAPAROSCOPE (MISCELLANEOUS) ×3 IMPLANT
WATER STERILE IRR 1000ML POUR (IV SOLUTION) ×3 IMPLANT

## 2013-09-19 NOTE — Discharge Instructions (Signed)

## 2013-09-19 NOTE — Brief Op Note (Signed)
09/19/2013  8:30 AM  PATIENT:  [name] B Pannone  68 y.o. female  PRE-OPERATIVE DIAGNOSIS: History of Breast Cancer x 2, Family history of ovarian cancer, desires prophylactic oophorectomy  POST-OPERATIVE DIAGNOSIS:  Same  PROCEDURE:  Procedure(s): LAPAROSCOPIC  BILATERAL SALPINGO OOPHORECTOMY WITH WASHINGS (N/A)  SURGEON:  Surgeon(s) and Role:    * Cyril Mourning, MD - Primary  PHYSICIAN ASSISTANT:   ASSISTANTS: none   ANESTHESIA:   general  EBL:  Total I/O In: 1000 [I.V.:1000] Out: -   BLOOD ADMINISTERED:none  DRAINS: none   LOCAL MEDICATIONS USED:  MARCAINE     SPECIMEN:  Source of Specimen:  washings, tubes and ovaries  DISPOSITION OF SPECIMEN:  PATHOLOGY  COUNTS:  YES  TOURNIQUET:  * No tourniquets in log *  DICTATION: .Other Dictation: Dictation Number [id number]  PLAN OF CARE: Discharge to home after PACU  PATIENT DISPOSITION:  PACU - hemodynamically stable.   Delay start of Pharmacological VTE agent (>24hrs) due to surgical blood loss or risk of bleeding: not applicable

## 2013-09-19 NOTE — Transfer of Care (Signed)
Immediate Anesthesia Transfer of Care Note  Patient: Anna Frank  Procedure(s) Performed: Procedure(s): LAPAROSCOPIC  BILATERAL SALPINGO OOPHORECTOMY WITH WASHINGS (N/A)  Patient Location: PACU  Anesthesia Type:General  Level of Consciousness: awake  Airway & Oxygen Therapy: Patient Spontanous Breathing and Patient connected to nasal cannula oxygen  Post-op Assessment: Report given to PACU RN and Post -op Vital signs reviewed and stable  Post vital signs: stable  Complications: No apparent anesthesia complications

## 2013-09-19 NOTE — Anesthesia Preprocedure Evaluation (Signed)
Anesthesia Evaluation  Patient identified by MRN, date of birth, ID band Patient awake    Reviewed: Allergy & Precautions, H&P , NPO status , Patient's Chart, lab work & pertinent test results, reviewed documented beta blocker date and time   Airway Mallampati: I TM Distance: >3 FB Neck ROM: full    Dental  (+) Teeth Intact   Pulmonary former smoker,  breath sounds clear to auscultation        Cardiovascular hypertension, Pt. on medications negative cardio ROS  Rhythm:regular Rate:Normal     Neuro/Psych negative psych ROS   GI/Hepatic negative GI ROS, Neg liver ROS,   Endo/Other  diabetes, Well Controlled, Type 2, Oral Hypoglycemic Agents  Renal/GU negative Renal ROS     Musculoskeletal   Abdominal   Peds  Hematology negative hematology ROS (+)   Anesthesia Other Findings   Reproductive/Obstetrics negative OB ROS                           Anesthesia Physical Anesthesia Plan  ASA: II  Anesthesia Plan: General   Post-op Pain Management:    Induction: Intravenous  Airway Management Planned: Oral ETT  Additional Equipment:   Intra-op Plan:   Post-operative Plan: Extubation in OR  Informed Consent: I have reviewed the patients History and Physical, chart, labs and discussed the procedure including the risks, benefits and alternatives for the proposed anesthesia with the patient or authorized representative who has indicated his/her understanding and acceptance.   Dental Advisory Given  Plan Discussed with: CRNA, Anesthesiologist and Surgeon  Anesthesia Plan Comments:         Anesthesia Quick Evaluation

## 2013-09-19 NOTE — Progress Notes (Signed)
History and physical  No complications Will proceed with Laparoscopic BSO  Consent signed

## 2013-09-19 NOTE — Anesthesia Postprocedure Evaluation (Signed)
  Anesthesia Post-op Note  Anesthesia Post Note  Patient: Anna Frank  Procedure(s) Performed: Procedure(s) (LRB): LAPAROSCOPIC  BILATERAL SALPINGO OOPHORECTOMY WITH WASHINGS (N/A)  Anesthesia type: General  Patient location: PACU  Post pain: Pain level controlled  Post assessment: Post-op Vital signs reviewed  Last Vitals:  Filed Vitals:   09/19/13 0915  BP: 146/61  Pulse: 67  Temp:   Resp: 18    Post vital signs: Reviewed  Level of consciousness: sedated  Complications: No apparent anesthesia complications

## 2013-09-20 ENCOUNTER — Encounter (HOSPITAL_COMMUNITY): Payer: Self-pay | Admitting: Obstetrics and Gynecology

## 2013-09-20 NOTE — Op Note (Signed)
NAME:  Anna Frank, Anna Frank             ACCOUNT NO.:  [id number]  MEDICAL RECORD NO.:  [id number]  LOCATION:  [facility]                          FACILITY:  [facility]  PHYSICIAN:  Micheal Sheen L. Abyan Cadman, M.D.DATE OF BIRTH:  [birth year]  DATE OF PROCEDURE:  09/19/2013 DATE OF DISCHARGE:  09/19/2013                              OPERATIVE REPORT   PREOPERATIVE DIAGNOSIS:  History of breast cancer x2 and desires prophylactic oophorectomy and family history of ovarian cancer.  POSTOPERATIVE DIAGNOSIS:  History of breast cancer x2 and desires prophylactic oophorectomy and family history of ovarian cancer.  PROCEDURE:  Laparoscopic bilateral salpingo-oophorectomy, and pelvic washings.  SURGEON:  Madysun Thall L. Helane Rima, MD  ANESTHESIA:  General.  ESTIMATED BLOOD LOSS:  Minimal.  COMPLICATIONS:  None.  PROCEDURE IN DETAIL:  The patient was taken to the operating room where anesthesia was administered.  She was prepped and draped in usual sterile fashion.  A Foley catheter was inserted for the case and removed at the end of the case.  A tenaculum was placed on the cervix. Attention was turned to the umbilicus.  A small infraumbilical incision was made and a Veress needle was inserted and pneumoperitoneum was performed.  The patient was placed in Trendelenburg position.  After the laparoscope was introduced through the trocar that was inserted at the umbilicus, no area of intestinal injury or bleeding was noted.  A 5-mm trocar was inserted suprapubically.  Inspection of the pelvis revealed the following.  The adnexa were normal.  The uterus was normal except it did have small fibroids.  There were no adhesions and no endometriosis. The pelvic washings were performed and then set aside and sent to Pathology.  We then started on the left side by grasping the left tube and ovary, and placed an EnSeal just beneath the ovary careful attention to avoid the side wall.  The ureter was deep in the pelvis and away  from EnSeal instrument.  We cauterized and carried all the way down the mesosalpinx and released the tube and ovary.  The tube and ovary were so small was able to pick it up and removed it through the 11 mm trocar. That was sent aside and separately labeled as left fallopian tube and ovary.  We did a similar identical procedure on the right side again with careful attention to avoid the ureter and the side wall.  There was no bleeding and all the specimens were removed and separately identified.  The pneumoperitoneum was released and trocars were removed. The incisions were closed with 3-0 Vicryl interrupted.  Dermabond was applied.  All the instruments and the Foley were removed from the vagina.  All sponge, lap, and instrument counts were correct x2.  The patient went to recovery room in stable condition.     Elster Corbello L. Helane Rima, M.D.     Nevin Bloodgood  D:  09/19/2013  T:  09/20/2013  Job:  [id number]

## 2013-09-21 MED FILL — Heparin Sodium (Porcine) Inj 5000 Unit/ML: INTRAMUSCULAR | Qty: 1 | Status: AC

## 2013-10-14 ENCOUNTER — Other Ambulatory Visit: Payer: Self-pay | Admitting: Dermatology

## 2013-11-11 ENCOUNTER — Telehealth: Payer: Self-pay | Admitting: *Deleted

## 2013-11-11 NOTE — Telephone Encounter (Signed)
Left message for pt to return my call so I can reschedule her appt.  

## 2013-11-11 NOTE — Telephone Encounter (Signed)
Pt returned my call and I explained the reason for having to reschedule her appt.  She is refusing to see anyone other than Dr. Jannifer Rodney.  Confirmed 01/01/14 appt w/ pt.

## 2013-11-14 ENCOUNTER — Ambulatory Visit: Payer: Medicare Other | Admitting: Oncology

## 2013-11-14 ENCOUNTER — Other Ambulatory Visit: Payer: Medicare Other

## 2013-12-25 ENCOUNTER — Telehealth: Payer: Self-pay | Admitting: Oncology

## 2013-12-25 NOTE — Telephone Encounter (Signed)
pt cld to cancel appt stated will be out of town-will call to r/s once shes back. Only wants to see GM

## 2014-01-01 ENCOUNTER — Other Ambulatory Visit: Payer: Medicare Other

## 2014-01-01 ENCOUNTER — Ambulatory Visit: Payer: Medicare Other | Admitting: Oncology

## 2014-01-21 ENCOUNTER — Telehealth: Payer: Self-pay | Admitting: *Deleted

## 2014-01-21 NOTE — Telephone Encounter (Signed)
Pt returned my call and I confirmed 02/05/14 appt w/ her.

## 2014-02-05 ENCOUNTER — Telehealth: Payer: Self-pay | Admitting: Oncology

## 2014-02-05 ENCOUNTER — Ambulatory Visit (HOSPITAL_BASED_OUTPATIENT_CLINIC_OR_DEPARTMENT_OTHER): Payer: Medicare Other | Admitting: Oncology

## 2014-02-05 ENCOUNTER — Other Ambulatory Visit (HOSPITAL_BASED_OUTPATIENT_CLINIC_OR_DEPARTMENT_OTHER): Payer: Medicare Other

## 2014-02-05 VITALS — BP 129/73 | HR 72 | Temp 98.2°F | Resp 20 | Ht 62.5 in | Wt 144.2 lb

## 2014-02-05 DIAGNOSIS — Z853 Personal history of malignant neoplasm of breast: Secondary | ICD-10-CM

## 2014-02-05 DIAGNOSIS — C50411 Malignant neoplasm of upper-outer quadrant of right female breast: Secondary | ICD-10-CM

## 2014-02-05 DIAGNOSIS — C50911 Malignant neoplasm of unspecified site of right female breast: Secondary | ICD-10-CM

## 2014-02-05 LAB — COMPREHENSIVE METABOLIC PANEL (CC13)
ALK PHOS: 63 U/L (ref 40–150)
ALT: 21 U/L (ref 0–55)
AST: 21 U/L (ref 5–34)
Albumin: 4 g/dL (ref 3.5–5.0)
Anion Gap: 10 mEq/L (ref 3–11)
BILIRUBIN TOTAL: 0.49 mg/dL (ref 0.20–1.20)
BUN: 10.3 mg/dL (ref 7.0–26.0)
CO2: 24 mEq/L (ref 22–29)
Calcium: 9.4 mg/dL (ref 8.4–10.4)
Chloride: 105 mEq/L (ref 98–109)
Creatinine: 0.8 mg/dL (ref 0.6–1.1)
Glucose: 152 mg/dl — ABNORMAL HIGH (ref 70–140)
POTASSIUM: 3.7 meq/L (ref 3.5–5.1)
SODIUM: 139 meq/L (ref 136–145)
TOTAL PROTEIN: 6.6 g/dL (ref 6.4–8.3)

## 2014-02-05 LAB — CBC WITH DIFFERENTIAL/PLATELET
BASO%: 0.9 % (ref 0.0–2.0)
Basophils Absolute: 0.1 10*3/uL (ref 0.0–0.1)
EOS%: 3.6 % (ref 0.0–7.0)
Eosinophils Absolute: 0.3 10*3/uL (ref 0.0–0.5)
HCT: 37.2 % (ref 34.8–46.6)
HGB: 11.7 g/dL (ref 11.6–15.9)
LYMPH%: 29.8 % (ref 14.0–49.7)
MCH: 26.2 pg (ref 25.1–34.0)
MCHC: 31.4 g/dL — AB (ref 31.5–36.0)
MCV: 83.6 fL (ref 79.5–101.0)
MONO#: 0.6 10*3/uL (ref 0.1–0.9)
MONO%: 8.1 % (ref 0.0–14.0)
NEUT#: 4 10*3/uL (ref 1.5–6.5)
NEUT%: 57.6 % (ref 38.4–76.8)
Platelets: 244 10*3/uL (ref 145–400)
RBC: 4.46 10*6/uL (ref 3.70–5.45)
RDW: 14.7 % — AB (ref 11.2–14.5)
WBC: 7 10*3/uL (ref 3.9–10.3)
lymph#: 2.1 10*3/uL (ref 0.9–3.3)

## 2014-02-05 NOTE — Progress Notes (Signed)
ID: Anna Frank OB: 10/27/1945  MR#: [id number]  NUU#:[id number]  PCP: Anna Crouch, Frank GYN:  Anna Frank SU: Anna Frank OTHER Frank: Anna Frank  CHIEF COMPLAINT: Bilateral breast cancer CURRENT TREATMENT: Observation   HISTORY OF PRESENT ILLNESS: From the original intake note:  Anna Frank's history goes all the way back to 1998, when she had a right upper-outer quadrant breast biopsy for an estrogen receptor positive breast cancer. She underwent a right lumpectomy and axillary lymph node dissection under Anna Frank for a pT2 pN2, stage IIIA invasive lobular breast cancer, treated adjuvantly with high-dose chemotherapy and autologous transplantation at [facility]. She then received adjuvant radiation followed by adjuvant letrozole for 5 years.  In 2007 she was found to have a local recurrence and underwent simple right mastectomy 09/13/2005 for an mpT1b NX, stage IA invasive lobular breast cancer, grade 2, estrogen receptor 95% positive, progesterone receptor and HER-2 negative, with an MIB-1 of 19%. She was started on exemestane and this was continued on until March of 1994.  More recently she had multiple biopsies of the left breast, nonmalignant, leading to a final decision for left lumpectomy and x-ray lymph node sampling 01/01/2013 under Anna Frank. This showed only atypical lobular hyperplasia.  Her subsequent history is as detailed below  INTERVAL HISTORY: Anna Frank returns today for followup of her breast cancer, accompanied by her husband Anna Frank. The interval history is significant for her having finally undergone colonoscopy, 09/04/2013, with several polyps removed, all benign ([id number]).  She had shave removal of a a basal cell carcinoma  from the medial left ankle 10/14/2013. She also underwent bilateral salpingo-oophorectomy 09/19/2013, with benign pathology ([id number]).  REVIEW OF SYSTEMS: Aside from all the procedures above, Anna  is doing well. She is not exercising regularly, although she does all her primary household and gardening activities. Recently she developed a rash over both legs, which was evaluated by Anna Frank and is felt to be secondary to poison oak or similar exposure. Aside from that a detailed review of systems today was noncontributory  PAST MEDICAL HISTORY: Past Medical History  Diagnosis Date  . 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 Hypertension   . Hyperlipemia   . Family history of anesthesia complication     "one sister has PONV" (01/01/2013)  . Breast cancer 1998    right  . Breast cancer 01/01/2013    left  . Type II diabetes mellitus   . PONV (postoperative nausea and vomiting)   . Stroke 1998    denies residual on 01/01/2013    PAST SURGICAL HISTORY: Past Surgical History  Procedure Laterality Date  . Mastectomy Right 2007  . Mastectomy complete / simple Left 01/01/2013  . Tonsillectomy  ~ 1954  . Breast lumpectomy  1998  . Breast biopsy Bilateral     "lots over the years" (01/01/2013)  . 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 Total mastectomy Left 01/01/2013    Procedure: TOTAL MASTECTOMY;  Surgeon: Anna Frank;  Location: [facility];  Service: General;  Laterality: Left;  . Laparoscopy N/A 09/19/2013    Procedure: LAPAROSCOPIC  BILATERAL SALPINGO OOPHORECTOMY WITH WASHINGS;  Surgeon: Anna Frank;  Location: [facility];  Service: Gynecology;  Laterality: N/A;    FAMILY HISTORY Family History  Problem Relation Age of Onset  . Heart disease Mother   . Cancer Sister     ovarian  .  Kidney cancer Sister   . Prostate cancer Paternal Uncle   . Prostate cancer Paternal Grandfather   . Breast cancer Cousin     4 paternal cousins with breast cancer in thier 99s  The patient's father died at the age of 41. The patient's mother died at the age of 72. The patient had one brother, 3 sisters. One sister was diagnosed with ovarian  cancer at the age of 58. The patient's family history is well documented in our genetics counseling node from 07/04/2012, at which time testing did term and the patient did not carry a BRCA mutation  GYNECOLOGIC HISTORY:  Menarche age 61, the patient never carried a child to term (she became postmenopausal with chemotherapy in 1998). She took birth control between 47 in 5809 with no complications. 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 Anna Frank is retired from [employer].    ADVANCED DIRECTIVES: In place   HEALTH MAINTENANCE: History  Substance Use Topics  . Smoking status: Former Smoker -- 1.00 packs/day for 20 years    Types: Cigarettes    Quit date: 08/18/1996  . Smokeless tobacco: Never Used  . Alcohol Use: Yes     Comment: 01/01/2013 "4oz glass of wine when I drink; none for 3-4 weeks"     Colonoscopy: Feb 2015, negative  PAP:  Bone density: 04/12/2012, at [facility], normal  Lipid panel:  Allergies  Allergen Reactions  . Citric Acid Other (See Comments)    Suggested by Frank  . Tryptophan Other (See Comments)    Affects bladder.  . Tyrosine     Urinary irritation    Current Outpatient Prescriptions  Medication Sig Dispense Refill  . aspirin 81 MG tablet Take 81 mg by mouth daily.      Marland Kitchen b complex vitamins tablet Take 1 tablet by mouth daily.      . fish oil-omega-3 fatty acids 1000 MG capsule Take 1 g by mouth daily.      Marland Kitchen HYDROcodone-acetaminophen (NORCO) 10-325 MG per tablet Take 1 tablet by mouth every 6 (six) hours as needed.  30 tablet  0  . Liraglutide (VICTOZA [place]) Inject into the skin.      Marland Kitchen lisinopril (PRINIVIL,ZESTRIL) 10 MG tablet Take 10 mg by mouth daily.      . metFORMIN (GLUCOPHAGE) 1000 MG tablet Take 1,000 mg by mouth 2 (two) times daily with a meal.      . phentermine 37.5 MG capsule Take 37.5 mg by mouth daily as needed.      . simvastatin (ZOCOR) 20 MG tablet Take 20 mg  by mouth every evening.      . vitamin C (ASCORBIC ACID) 500 MG tablet Take 500 mg by mouth daily.      . vitamin E 1000 UNIT capsule Take 1,000 Units by mouth daily.       No current facility-administered medications for this visit.    OBJECTIVE: Middle-aged white woman who appears stated age 30 Vitals:   02/05/14 1501  BP: 129/73  Pulse: 72  Temp: 98.2 F (36.8 C)  Resp: 20     Body mass index is 25.94 kg/(m^2).    ECOG FS:1 - Symptomatic but completely ambulatory  Ocular: Sclerae unicteric, pupils round and equal Ear-nose-throat: Oropharynx clear and moist Lymphatic: No cervical or supraclavicular adenopathy Lungs no rales or rhonchi Heart regular rate and rhythm Abd soft, nontender, positive bowel sounds MSK  no focal spinal tenderness, no upper extremity lymphedema Neuro: non-focal, well-oriented, appropriate affect Breasts: Status post bilateral mastectomies. There is no evidence of local recurrence. Both axillae are benign  LAB RESULTS:  CMP     Component Value Date/Time   NA 139 09/17/2013 1210   NA 139 05/14/2013 1603   NA 139 12/03/2008 1046   K 3.9 09/17/2013 1210   K 3.8 05/14/2013 1603   K 4.0 12/03/2008 1046   CL 98 09/17/2013 1210   CL 101 12/03/2008 1046   CO2 28 09/17/2013 1210   CO2 22 05/14/2013 1603   CO2 29 12/03/2008 1046   GLUCOSE 152* 09/17/2013 1210   GLUCOSE 189* 05/14/2013 1603   GLUCOSE 169* 12/03/2008 1046   BUN 8 09/17/2013 1210   BUN 10.0 05/14/2013 1603   BUN 13 12/03/2008 1046   CREATININE 0.61 09/17/2013 1210   CREATININE 0.8 05/14/2013 1603   CREATININE 0.8 12/03/2008 1046   CALCIUM 9.6 09/17/2013 1210   CALCIUM 9.4 05/14/2013 1603   CALCIUM 9.4 12/03/2008 1046   PROT 7.2 09/17/2013 1210   PROT 6.7 05/14/2013 1603   PROT 7.2 12/03/2008 1046   ALBUMIN 4.4 09/17/2013 1210   ALBUMIN 3.9 05/14/2013 1603   AST 22 09/17/2013 1210   AST 23 05/14/2013 1603   AST 28 12/03/2008 1046   ALT 20 09/17/2013 1210   ALT 25 05/14/2013 1603   ALT 29 12/03/2008 1046    ALKPHOS 61 09/17/2013 1210   ALKPHOS 77 05/14/2013 1603   ALKPHOS 61 12/03/2008 1046   BILITOT 0.5 09/17/2013 1210   BILITOT 0.43 05/14/2013 1603   BILITOT 0.90 12/03/2008 1046   GFRNONAA >90 09/17/2013 1210   GFRAA >90 09/17/2013 1210    I No results found for this basename: SPEP,  UPEP,   kappa and lambda light chains    Lab Results  Component Value Date   WBC 7.0 02/05/2014   NEUTROABS 4.0 02/05/2014   HGB 11.7 02/05/2014   HCT 37.2 02/05/2014   MCV 83.6 02/05/2014   PLT 244 02/05/2014      Chemistry      Component Value Date/Time   NA 139 09/17/2013 1210   NA 139 05/14/2013 1603   NA 139 12/03/2008 1046   K 3.9 09/17/2013 1210   K 3.8 05/14/2013 1603   K 4.0 12/03/2008 1046   CL 98 09/17/2013 1210   CL 101 12/03/2008 1046   CO2 28 09/17/2013 1210   CO2 22 05/14/2013 1603   CO2 29 12/03/2008 1046   BUN 8 09/17/2013 1210   BUN 10.0 05/14/2013 1603   BUN 13 12/03/2008 1046   CREATININE 0.61 09/17/2013 1210   CREATININE 0.8 05/14/2013 1603   CREATININE 0.8 12/03/2008 1046      Component Value Date/Time   CALCIUM 9.6 09/17/2013 1210   CALCIUM 9.4 05/14/2013 1603   CALCIUM 9.4 12/03/2008 1046   ALKPHOS 61 09/17/2013 1210   ALKPHOS 77 05/14/2013 1603   ALKPHOS 61 12/03/2008 1046   AST 22 09/17/2013 1210   AST 23 05/14/2013 1603   AST 28 12/03/2008 1046   ALT 20 09/17/2013 1210   ALT 25 05/14/2013 1603   ALT 29 12/03/2008 1046   BILITOT 0.5 09/17/2013 1210   BILITOT 0.43 05/14/2013 1603   BILITOT 0.90 12/03/2008 1046       Lab Results  Component Value Date   LABCA2 23 12/03/2008    No components found with this basename: [id number]    No results found  for this basename: INR,  in the last 168 hours  Urinalysis    Component Value Date/Time   COLORURINE AMBER* 12/27/2012 [place] 12/27/2012 1437   LABSPEC 1.031* 12/27/2012 1437   PHURINE 5.0 12/27/2012 1437   GLUCOSEU NEGATIVE 12/27/2012 [place] 12/27/2012 1437   BILIRUBINUR NEGATIVE 12/27/2012 1437   KETONESUR 15*  12/27/2012 1437   PROTEINUR NEGATIVE 12/27/2012 1437   UROBILINOGEN 0.2 12/27/2012 1437   NITRITE NEGATIVE 12/27/2012 1437   LEUKOCYTESUR NEGATIVE 12/27/2012 1437    STUDIES: Results of recent procedures reviewed  ASSESSMENT: 68 y.o. BRCA negative [place], [place] woman  (1) s/p Right lumpectomy and ALND 1998 for a T2 N2, stage IIIA invasive lobular breast cancer, estrogen and progesterone receptor positive, treated with chemotherapy followed by autologous stem cell transplant at [facility], followed by radiation, followed by letrozole x 5 years  (2) s/p Right simple mastectomy  09/13/2005 for an mpT1b invasive lobular breast cancer, grade 2, estrogen receptor 95% positive, progesterone receptor and HER-2 negative, with an Mib-1 of 19%, treated adjuvantly with exemestane for 6-7 years  (3) s/p Left mastectomy and axillary node sampling 01/01/2013 for atypical lobular hyperplasia (no malignancy)  (4) Status post bilateral salpingo-oophorectomy 09/19/2013, with benign pathology ([id number])  (5) status post shave removal of a superficial and nodular basal cell carcinoma from the medial left ankle skin  PLAN: Anna is doing remarkably well from a breast cancer point of view, now 8 years out from her most recent breast cancer. Her health maintenance is up-to-date, status post colonoscopy with multiple small polyps but no malignancy, and status post bilateral salpingo-oophorectomy with benign pathology. She also had a basal cell removed from the medial left ankle.  I would not be uncomfortable seeing Anna on a once a year basis, but she prefers to see me every 6 months. Accordingly I will see her again in January. Between now and then I have encouraged her to start an exercise program and I gave her a copy of the [employer].  Unfortunately I do not have any simple solution for the rash in her legs. She thinks these are 2 years, but I really do think this is more like poison oak or poison ivy.  She could try Cortaid although likely it would not be terribly effective. Unfortunately the medication prescribed by her dermatologist turned out to be more than $500 for a three-day treatment.  Chauncey Cruel, Frank   02/05/2014 3:24 PM

## 2014-02-05 NOTE — Telephone Encounter (Signed)
per pof to sch pt appt-gave pt copy of sch °

## 2014-02-06 NOTE — Addendum Note (Signed)
Addended by: Prentiss Bells on: 02/06/2014 09:42 AM   Modules accepted: Orders, Medications

## 2014-02-28 ENCOUNTER — Telehealth (INDEPENDENT_AMBULATORY_CARE_PROVIDER_SITE_OTHER): Payer: Self-pay

## 2014-02-28 NOTE — Telephone Encounter (Signed)
Return patients call . She states she was calling to speak to Judson Roch. Inform her Judson Roch is not with the practice any longer. She also states she did see her GYN and does not need any further assistance and doing well

## 2014-06-24 ENCOUNTER — Ambulatory Visit: Payer: Self-pay | Admitting: Internal Medicine

## 2014-07-31 ENCOUNTER — Ambulatory Visit (HOSPITAL_BASED_OUTPATIENT_CLINIC_OR_DEPARTMENT_OTHER): Payer: Medicare Other | Admitting: Oncology

## 2014-07-31 ENCOUNTER — Other Ambulatory Visit (HOSPITAL_BASED_OUTPATIENT_CLINIC_OR_DEPARTMENT_OTHER): Payer: Medicare Other

## 2014-07-31 ENCOUNTER — Telehealth: Payer: Self-pay | Admitting: Oncology

## 2014-07-31 VITALS — BP 144/84 | HR 83 | Temp 98.2°F | Resp 18 | Ht 62.5 in | Wt 149.4 lb

## 2014-07-31 DIAGNOSIS — Z853 Personal history of malignant neoplasm of breast: Secondary | ICD-10-CM

## 2014-07-31 DIAGNOSIS — C50411 Malignant neoplasm of upper-outer quadrant of right female breast: Secondary | ICD-10-CM

## 2014-07-31 LAB — CBC WITH DIFFERENTIAL/PLATELET
BASO%: 1.2 % (ref 0.0–2.0)
BASOS ABS: 0.1 10*3/uL (ref 0.0–0.1)
EOS%: 2.7 % (ref 0.0–7.0)
Eosinophils Absolute: 0.2 10*3/uL (ref 0.0–0.5)
HCT: 35.5 % (ref 34.8–46.6)
HGB: 11 g/dL — ABNORMAL LOW (ref 11.6–15.9)
LYMPH%: 33.7 % (ref 14.0–49.7)
MCH: 24.9 pg — ABNORMAL LOW (ref 25.1–34.0)
MCHC: 31.1 g/dL — ABNORMAL LOW (ref 31.5–36.0)
MCV: 79.8 fL (ref 79.5–101.0)
MONO#: 0.5 10*3/uL (ref 0.1–0.9)
MONO%: 8.5 % (ref 0.0–14.0)
NEUT%: 53.9 % (ref 38.4–76.8)
NEUTROS ABS: 3.3 10*3/uL (ref 1.5–6.5)
Platelets: 268 10*3/uL (ref 145–400)
RBC: 4.44 10*6/uL (ref 3.70–5.45)
RDW: 14.1 % (ref 11.2–14.5)
WBC: 6.2 10*3/uL (ref 3.9–10.3)
lymph#: 2.1 10*3/uL (ref 0.9–3.3)

## 2014-07-31 LAB — COMPREHENSIVE METABOLIC PANEL (CC13)
ALT: 23 U/L (ref 0–55)
AST: 20 U/L (ref 5–34)
Albumin: 4.1 g/dL (ref 3.5–5.0)
Alkaline Phosphatase: 71 U/L (ref 40–150)
Anion Gap: 10 mEq/L (ref 3–11)
BUN: 11.7 mg/dL (ref 7.0–26.0)
CHLORIDE: 104 meq/L (ref 98–109)
CO2: 24 mEq/L (ref 22–29)
Calcium: 8.7 mg/dL (ref 8.4–10.4)
Creatinine: 0.8 mg/dL (ref 0.6–1.1)
EGFR: 73 mL/min/{1.73_m2} — ABNORMAL LOW (ref >90)
Glucose: 213 mg/dl — ABNORMAL HIGH (ref 70–140)
POTASSIUM: 4.4 meq/L (ref 3.5–5.1)
SODIUM: 138 meq/L (ref 136–145)
Total Bilirubin: 0.66 mg/dL (ref 0.20–1.20)
Total Protein: 6.4 g/dL (ref 6.4–8.3)

## 2014-07-31 NOTE — Telephone Encounter (Signed)
[facility] PT APPT SCHEDULE FOR JAN 2017 °

## 2014-07-31 NOTE — Progress Notes (Signed)
ID: [name] B Widrig OB: Feb 14, 1946  MR#: [id number]  WUJ#:[id number]  PCP: Idelle Crouch, MD GYN:  Dian Queen  SU: Fanny Skates OTHER MD: Zenovia Jarred  CHIEF COMPLAINT: Bilateral breast cancer CURRENT TREATMENT: Observation   HISTORY OF PRESENT ILLNESS: From the original intake note:  Delorese's history goes all the way back to 1998, when she had a right upper-outer quadrant breast biopsy for an estrogen receptor positive breast cancer. She underwent a right lumpectomy and axillary lymph node dissection under Marylene Buerger for a pT2 pN2, stage IIIA invasive lobular breast cancer, treated adjuvantly with high-dose chemotherapy and autologous transplantation at [facility]. She then received adjuvant radiation followed by adjuvant letrozole for 5 years.  In 2007 she was found to have a local recurrence and underwent simple right mastectomy 09/13/2005 for an mpT1b NX, stage IA invasive lobular breast cancer, grade 2, estrogen receptor 95% positive, progesterone receptor and HER-2 negative, with an MIB-1 of 19%. She was started on exemestane and this was continued on until March of 1994.  More recently she had multiple biopsies of the left breast, nonmalignant, leading to a final decision for left lumpectomy and x-ray lymph node sampling 01/01/2013 under Fanny Skates. This showed only atypical lobular hyperplasia.  Her subsequent history is as detailed below  INTERVAL HISTORY: Metta Clines returns today for followup of her breast cancer the interval history is generally unremarkable. She is very concerned about her husband and would like him seen by a geriatrician. She is also helping 88 23 year old aunt find a suitable assisted living place.  REVIEW OF SYSTEMS: She is not exercising regularly except she is doing a lot of housecleaning after moving 3 years ago, carrying boxes up and down stairs. She set a little bit of the upper respiratory infection and ear  drainage. She has some stress urinary incontinence. Her blood sugars are moderately well-controlled. A detailed review of systems today was otherwise stable.  PAST MEDICAL HISTORY: Past Medical History  Diagnosis Date  . 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 Hypertension   . Hyperlipemia   . Family history of anesthesia complication     "one sister has PONV" (01/01/2013)  . Breast cancer 1998    right  . Breast cancer 01/01/2013    left  . Type II diabetes mellitus   . PONV (postoperative nausea and vomiting)   . Stroke 1998    denies residual on 01/01/2013    PAST SURGICAL HISTORY: Past Surgical History  Procedure Laterality Date  . Mastectomy Right 2007  . Mastectomy complete / simple Left 01/01/2013  . Tonsillectomy  ~ 1954  . Breast lumpectomy  1998  . Breast biopsy Bilateral     "lots over the years" (01/01/2013)  . 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 Total mastectomy Left 01/01/2013    Procedure: TOTAL MASTECTOMY;  Surgeon: Adin Hector, MD;  Location: [facility];  Service: General;  Laterality: Left;  . Laparoscopy N/A 09/19/2013    Procedure: LAPAROSCOPIC  BILATERAL SALPINGO OOPHORECTOMY WITH WASHINGS;  Surgeon: Cyril Mourning, MD;  Location: [facility];  Service: Gynecology;  Laterality: N/A;    FAMILY HISTORY Family History  Problem Relation Age of Onset  . Heart disease Mother   . Cancer Sister     ovarian  . Kidney cancer Sister   . Prostate cancer Paternal Uncle   . Prostate cancer Paternal Grandfather   . Breast cancer Cousin  4 paternal cousins with breast cancer in thier 56s  The patient's father died at the age of 20. The patient's mother died at the age of 52. The patient had one brother, 3 sisters. One sister was diagnosed with ovarian cancer at the age of 67. The patient's family history is well documented in our genetics counseling node from 07/04/2012, at which time testing did term and the patient did not  carry a BRCA mutation  GYNECOLOGIC HISTORY:  Menarche age 17, the patient never carried a child to term (she became postmenopausal with chemotherapy in 1998). She took birth control between 15 in 2836 with no complications. 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 Eddie Dibbles is retired from [employer].    ADVANCED DIRECTIVES: In place   HEALTH MAINTENANCE: History  Substance Use Topics  . Smoking status: Former Smoker -- 1.00 packs/day for 20 years    Types: Cigarettes    Quit date: 08/18/1996  . Smokeless tobacco: Never Used  . Alcohol Use: Yes     Comment: 01/01/2013 "4oz glass of wine when I drink; none for 3-4 weeks"     Colonoscopy: Feb 2015, negative  PAP:  Bone density: 04/17/2014 at [facility]/T score -1.1  Lipid panel:  Allergies  Allergen Reactions  . Citric Acid Other (See Comments)    Suggested by MD  . Tryptophan Other (See Comments)    Affects bladder.  . Tyrosine     Urinary irritation    Current Outpatient Prescriptions  Medication Sig Dispense Refill  . aspirin 81 MG tablet Take 81 mg by mouth daily.    Marland Kitchen b complex vitamins tablet Take 1 tablet by mouth daily.    . fish oil-omega-3 fatty acids 1000 MG capsule Take 1 g by mouth daily.    Marland Kitchen glimepiride (AMARYL) 2 MG tablet Take 1 1/2 tablets by mouth once daily as directed    . HYDROcodone-acetaminophen (NORCO) 10-325 MG per tablet Take 1 tablet by mouth every 6 (six) hours as needed. 30 tablet 0  . Insulin Glargine (LANTUS) 100 UNIT/ML Solostar Pen Inject into the skin.    Marland Kitchen lisinopril (PRINIVIL,ZESTRIL) 10 MG tablet Take 20 mg by mouth daily.     . metFORMIN (GLUCOPHAGE) 1000 MG tablet Take 1,000 mg by mouth 2 (two) times daily with a meal.    . simvastatin (ZOCOR) 20 MG tablet Take 40 mg by mouth every evening.     . vitamin C (ASCORBIC ACID) 500 MG tablet Take 500 mg by mouth daily.    . vitamin E 1000 UNIT capsule Take 1,000  Units by mouth daily.     No current facility-administered medications for this visit.    OBJECTIVE: Middle-aged white woman in no acute distress Filed Vitals:   07/31/14 1319  BP: 144/84  Pulse: 83  Temp: 98.2 F (36.8 C)  Resp: 18     Body mass index is 26.87 kg/(m^2).    ECOG FS:0 - Asymptomatic  Sclerae unicteric, pupils equal and reactive Oropharynx clear and moist No cervical or supraclavicular adenopathy Lungs no rales or rhonchi Heart regular rate and rhythm Abd soft, nontender, positive bowel sounds MSK no focal spinal tenderness, no upper extremity lymphedema Neuro: nonfocal, well oriented, appropriate affect Breasts: Status post bilateral mastectomies. There is no evidence of chest wall recurrence. Both axillae are benign.  LAB RESULTS:  CMP     Component Value Date/Time  NA 139 02/05/2014 1435   NA 139 09/17/2013 1210   NA 139 12/03/2008 1046   K 3.7 02/05/2014 1435   K 3.9 09/17/2013 1210   K 4.0 12/03/2008 1046   CL 98 09/17/2013 1210   CL 101 12/03/2008 1046   CO2 24 02/05/2014 1435   CO2 28 09/17/2013 1210   CO2 29 12/03/2008 1046   GLUCOSE 152* 02/05/2014 1435   GLUCOSE 152* 09/17/2013 1210   GLUCOSE 169* 12/03/2008 1046   BUN 10.3 02/05/2014 1435   BUN 8 09/17/2013 1210   BUN 13 12/03/2008 1046   CREATININE 0.8 02/05/2014 1435   CREATININE 0.61 09/17/2013 1210   CREATININE 0.8 12/03/2008 1046   CALCIUM 9.4 02/05/2014 1435   CALCIUM 9.6 09/17/2013 1210   CALCIUM 9.4 12/03/2008 1046   PROT 6.6 02/05/2014 1435   PROT 7.2 09/17/2013 1210   PROT 7.2 12/03/2008 1046   ALBUMIN 4.0 02/05/2014 1435   ALBUMIN 4.4 09/17/2013 1210   AST 21 02/05/2014 1435   AST 22 09/17/2013 1210   AST 28 12/03/2008 1046   ALT 21 02/05/2014 1435   ALT 20 09/17/2013 1210   ALT 29 12/03/2008 1046   ALKPHOS 63 02/05/2014 1435   ALKPHOS 61 09/17/2013 1210   ALKPHOS 61 12/03/2008 1046   BILITOT 0.49 02/05/2014 1435   BILITOT 0.5 09/17/2013 1210   BILITOT 0.90  12/03/2008 1046   GFRNONAA >90 09/17/2013 1210   GFRAA >90 09/17/2013 1210    I No results found for: SPEP  Lab Results  Component Value Date   WBC 6.2 07/31/2014   NEUTROABS 3.3 07/31/2014   HGB 11.0* 07/31/2014   HCT 35.5 07/31/2014   MCV 79.8 07/31/2014   PLT 268 07/31/2014      Chemistry      Component Value Date/Time   NA 139 02/05/2014 1435   NA 139 09/17/2013 1210   NA 139 12/03/2008 1046   K 3.7 02/05/2014 1435   K 3.9 09/17/2013 1210   K 4.0 12/03/2008 1046   CL 98 09/17/2013 1210   CL 101 12/03/2008 1046   CO2 24 02/05/2014 1435   CO2 28 09/17/2013 1210   CO2 29 12/03/2008 1046   BUN 10.3 02/05/2014 1435   BUN 8 09/17/2013 1210   BUN 13 12/03/2008 1046   CREATININE 0.8 02/05/2014 1435   CREATININE 0.61 09/17/2013 1210   CREATININE 0.8 12/03/2008 1046      Component Value Date/Time   CALCIUM 9.4 02/05/2014 1435   CALCIUM 9.6 09/17/2013 1210   CALCIUM 9.4 12/03/2008 1046   ALKPHOS 63 02/05/2014 1435   ALKPHOS 61 09/17/2013 1210   ALKPHOS 61 12/03/2008 1046   AST 21 02/05/2014 1435   AST 22 09/17/2013 1210   AST 28 12/03/2008 1046   ALT 21 02/05/2014 1435   ALT 20 09/17/2013 1210   ALT 29 12/03/2008 1046   BILITOT 0.49 02/05/2014 1435   BILITOT 0.5 09/17/2013 1210   BILITOT 0.90 12/03/2008 1046       Lab Results  Component Value Date   LABCA2 23 12/03/2008    No components found for: [id number]  No results for input(s): INR in the last 168 hours.  Urinalysis    Component Value Date/Time   COLORURINE AMBER* 12/27/2012 [place] 12/27/2012 1437   LABSPEC 1.031* 12/27/2012 1437   PHURINE 5.0 12/27/2012 [place] 12/27/2012 1437   HGBUR NEGATIVE 12/27/2012 1437   BILIRUBINUR NEGATIVE 12/27/2012 1437   KETONESUR  15* 12/27/2012 1437   PROTEINUR NEGATIVE 12/27/2012 1437   UROBILINOGEN 0.2 12/27/2012 1437   NITRITE NEGATIVE 12/27/2012 1437   LEUKOCYTESUR NEGATIVE 12/27/2012 1437    STUDIES: No results  found.   ASSESSMENT: 69 y.o. BRCA negative [place], [place] woman  (1) s/p Right lumpectomy and ALND 1998 for a T2 N2, stage IIIA invasive lobular breast cancer, estrogen and progesterone receptor positive, treated with chemotherapy followed by autologous stem cell transplant at [facility], followed by radiation, followed by letrozole x 5 years  (2) s/p Right simple mastectomy  09/13/2005 for an mpT1b invasive lobular breast cancer, grade 2, estrogen receptor 95% positive, progesterone receptor and HER-2 negative, with an Mib-1 of 19%, treated adjuvantly with exemestane for 6-7 years  (3) s/p Left mastectomy and axillary node sampling 01/01/2013 for atypical lobular hyperplasia (no malignancy)  (4) Status post bilateral salpingo-oophorectomy 09/19/2013, with benign pathology ([id number])  (5) status post shave removal of a superficial and nodular basal cell carcinoma from the medial left ankle skin  PLAN: Metta Clines is doing fine from a breast cancer point of view. She understands that I would be comfortable releasing her from follow-up here, but she get something out of the yearly visits. We now have a survivorship clinic and I described that to her. She would be interested in participating and I will ask our survivorship navigator to give her a call sometime in July or August to set that up.  [name] is concerned about Eddie Dibbles. We discussed referral to a geriatric specialist. I gave her a name to consider.  Her osteopenia is minimal. She is already on calcium and vitamin D. I don't think any further interventions are required  She knows to call for any problems that may develop before her next visit here  Chauncey Cruel, MD   07/31/2014 1:47 PM

## 2014-08-20 ENCOUNTER — Other Ambulatory Visit: Payer: Self-pay | Admitting: *Deleted

## 2014-08-20 ENCOUNTER — Encounter: Payer: Self-pay | Admitting: *Deleted

## 2014-08-20 ENCOUNTER — Telehealth: Payer: Self-pay | Admitting: *Deleted

## 2014-08-20 NOTE — Telephone Encounter (Signed)
Patient called stating that she recently saw her endocrinologist and was started on Synthroid 50 mcg daily. She just wanted to make sure it got entered into her chart at the cancer center. Told patient I would add it to her medication list.

## 2014-08-25 ENCOUNTER — Telehealth: Payer: Self-pay | Admitting: Adult Health

## 2014-08-25 NOTE — Telephone Encounter (Signed)
I spoke with Ms. Lamadrid and we were able to schedule her for her initial Survivorship Clinic visit on Tuesday, 01/27/15 at 1:30pm.  I gave her instructions about where to check-in for this appointment, as well as my office phone number for her to call me if she has any issues or concerns prior to her appointment with me.  I look forward to participating in her care.   Mike Craze, NP [place] [phone]

## 2014-11-07 NOTE — H&P (Signed)
PATIENT NAME:  Anna Frank, Anna Frank MR#:  [id number] DATE OF BIRTH:  10-May-1946  DATE OF ADMISSION:  01/17/2013  HISTORY OF PRESENT ILLNESS: Anna Frank is a 69 year old white married lady followed by Dr. Georgie Chard, who presented to the office complaining of feeling lightheaded and weak. She was triaged in the office where she was noted to be hypotensive and sent directly to the Emergency Room. She was noted to have a white count of 16,500. She was also hypotensive. History was notable for the fact that she had had a mastectomy on the left on June 17th.   She was seen 3 days ago by her surgeon in [place] who aspirated serosanguineous fluid from the distal end of the surgical site in the left axilla. The patient states that yesterday she started feeling ill and having chills. She did not take her temperature. She had had some nausea, but no vomiting.   PAST MEDICAL HISTORY:   Notable for hypertension, hyperlipidemia and type 2 diabetes with peripheral neuropathy. The patient had had a previous CVA in 1998, but has very little residual. In the distant past, she had a right mastectomy and was treated with stem cell therapy for breast cancer. As noted above, she had a left mastectomy on the 17th of June. This was done by Dr. Dalbert Batman in [place].   ALLERGIES: The patient has no drug allergies.   MEDICATIONS ON ADMISSION: Included lisinopril 50 mg daily, metformin 500 mg b.i.d., simvastatin 40 mg daily, Ecotrin 81 mg daily, phentermine 37.5 mg daily, Actos 15 mg daily and glimepiride 2 mg daily.   SOCIAL HISTORY: Reveals that she is an ex-smoker. She does have 2 to 3 alcoholic beverages a week.   FAMILY HISTORY: Positive for type 2 diabetes and for breast cancer.   REVIEW OF SYSTEMS:  Essentially unremarkable as per the questionnaire in the ER with the exception of the present illness.   PHYSICAL EXAMINATION: VITAL SIGNS: Currently, blood pressure 91/46, pulse 85, respirations 16.  GENERAL: This is an  elderly lady who is alert and oriented. She actually looks much better since she has received fluids in the Emergency Room, although she is still relatively hypotensive.  SKIN: Normal in color and texture. She is not diaphoretic. There are no rashes or petechiae.  HEAD, EARS, EYES, NOSE AND THROAT:  Was relatively unremarkable. The mucous membranes were dry.  NECK: Supple. There was no JVD. There were no carotid bruits.  LUNGS AND CHEST: Lung fields were clear to auscultation. The patient is status post bilateral mastectomies. She does have a recent surgical scar on the left upper chest. The distal end of the incision was still a little boggy, but it was not warm to the touch and was not fluctuant.  CARDIAC:  Reveals a regular rhythm. There were no murmurs or gallops. S1, S2 were normal.  ABDOMEN: Soft and nontender. Liver, spleen are not enlarged. Bowel sounds were normal.  EXTREMITIES: Show no edema or deformity.  NEUROLOGIC: Physiological.   PLAN: The patient is being admitted initially to observation. She will have cultures obtained. She will be started on Zosyn IV pending culture report. We will continue with IV rehydration. I held her blood pressure medicines due to her relative hypotension. Also, I held her diabetic medications because she had a slightly elevated lactic acid in the ER. She will be covered with a sliding scale. If the patient looks better in the morning, we may send her home with oral antibiotics, as her incision site does  not look markedly inflamed.     ____________________________ Hewitt Blade. Sarina Ser, MD jbw:dmm D: 01/17/2013 19:38:22 ET T: 01/17/2013 19:54:26 ET JOB#: [id number]  cc: Jenny Reichmann B. Sarina Ser, MD, <Dictator> Lottie Mussel III MD ELECTRONICALLY SIGNED 01/29/2013 7:25

## 2015-01-20 ENCOUNTER — Telehealth: Payer: Self-pay | Admitting: Adult Health

## 2015-01-20 NOTE — Telephone Encounter (Signed)
I spoke with Anna Frank to touch base regarding her scheduled survivorship visit on 01/27/15.  I let her know that  I would need to reschedule that appt due to a scheduling conflict, as I will be presenting at a conference.  She told me that she lost her day planner and was going to try to call me to reschedule that appt anyways.  She has been having trouble with some of her medications for HTN and diabetes and is working with her PCP and specialists to help her feel better.  She has a chronic cough that she is concerned about that she has been told is potentially r/t her anti-hypertensive meds.  She asked me about a referral to a Pulmonologist in [place]. I recommended [facility] as they are in the [facility] system and would have access to all of her records.  I encouraged her to reach out to her PCP if she needs a formal referral order placed, as it sounds as if this issue is not related to her h/o breast cancer and the most appropriate person to help her with that referral would be her PCP.  Anna Frank expressed verbal understanding.    I offered her an opportunity to reschedule her survivorship appt and she would like to do so in several months.  I informed her that we would be welcoming a new NP to our team and she would be taking over the breast survivorship program and we could try to give Anna Frank a call in the Fall 2016 to attempt to reschedule her survivorship visit.  She agreed with this plan.   I also gave her the 07/2015 appointments for lab and visit with Dr. Jana Hakim, since she stated that she had lost her day planner/calendar.  She expressed appreciation for my call.  I let her know that I hope she feels better soon and gave her my direct office number to call me if she has any additional questions or concerns.   Mike Craze, NP [place] [phone]

## 2015-01-27 ENCOUNTER — Ambulatory Visit: Payer: Medicare Other | Admitting: Adult Health

## 2015-03-31 ENCOUNTER — Telehealth: Payer: Self-pay | Admitting: Oncology

## 2015-03-31 NOTE — Telephone Encounter (Signed)
Left message for patient re 10/3 LTS f/u. Schedule mailed.

## 2015-04-16 ENCOUNTER — Telehealth: Payer: Self-pay | Admitting: Oncology

## 2015-04-16 NOTE — Telephone Encounter (Signed)
Returned patient's call re rescheduling 10/3 LTS visit. Spoke with patient re new appointment for 11/16 - November per patient.

## 2015-04-20 ENCOUNTER — Encounter: Payer: Medicare Other | Admitting: Nurse Practitioner

## 2015-05-07 ENCOUNTER — Telehealth: Payer: Self-pay | Admitting: Oncology

## 2015-05-07 NOTE — Telephone Encounter (Signed)
Returned patient call re r/s 11/16 LTS appointment. Gave patient new appointment for 10/24.

## 2015-05-11 ENCOUNTER — Encounter: Payer: Self-pay | Admitting: Nurse Practitioner

## 2015-05-11 ENCOUNTER — Telehealth: Payer: Self-pay | Admitting: Nurse Practitioner

## 2015-05-11 ENCOUNTER — Ambulatory Visit (HOSPITAL_BASED_OUTPATIENT_CLINIC_OR_DEPARTMENT_OTHER): Payer: Medicare Other | Admitting: Nurse Practitioner

## 2015-05-11 VITALS — BP 119/61 | HR 82 | Temp 98.9°F | Resp 18 | Ht 62.5 in | Wt 148.3 lb

## 2015-05-11 DIAGNOSIS — Z853 Personal history of malignant neoplasm of breast: Secondary | ICD-10-CM

## 2015-05-11 DIAGNOSIS — C50411 Malignant neoplasm of upper-outer quadrant of right female breast: Secondary | ICD-10-CM

## 2015-05-11 NOTE — Patient Instructions (Signed)
Thank you for coming in today!  Please keep your appointment with Dr. Jana Hakim for January and you will see me again in 1 year.  Let us know if you note any change or new symptom.  Look forward to working with [facility]!   Symptoms to Watch for and Report to Your Provider  . Return of the cancer symptoms you had before - such as a lump or change in the area of your incision (or changes in reconstructed breast, if reconstruction completed) . New or unusual pain that seems unrelated to an injury and does not go away, including back pain or bone pain . Weight loss without trying/intending . Unexplained bleeding . A rash or allergic reaction, such as swelling, severe itching or wheezing . Chills or fevers . Persistent headaches . Shortness of breath or difficulty breathing . Bloody stools or blood in your urine . Nausea, vomiting, diarrhea, loss of appetite, or trouble swallowing . A cough that does not go away . Abdominal pain . Swelling in your arms or legs . Fractures . Hot flashes or other menopausal symptoms . Any other signs mentioned by your doctor or nurse or any unusual symptoms                 that you just can't explain   NOTE: Just because you have certain symptoms, it doesn't mean the cancer has come back or you have a new cancer. Symptoms can be due to other problems that need to be addressed.  It is important to watch for these symptoms and report them to your provider so you can be medically evaluated for any of these concerns!    Living a Life of Wellness After Cancer:  *Note: Please consult your health care provider before using any medications, supplements, over-the-counter products, or other interventions.  Also, please consult your primary care provider before you begin any lifestyle program (diet, exercise, etc.).  Your safety is our top priority and we want to make sure you continue to live a long and healthy life!    Healthy Lifestyle Recommendations  As a cancer survivor,  it is important develop a lifelong commitment to a healthy lifestyle. A healthy lifestyle can prevent cancer from returning as well as prevent other diseases like heart disease, diabetes and high blood pressure.  These are some things that you can do to have a healthy lifestyle:  Marland Kitchen Maintain a healthy weight.  . Exercise daily per your doctor's orders. . Eat a balanced diet high in fruits, vegetables, bran, and fiber. . Limit how much alcohol you consume, if at all. Ali Lowe regular bone mineral density testing for osteoporosis.  . Talk to your doctor about cardiovascular disease or "heart disease" screening. . Stop smoking (if you smoke). . Know your family history. . Be mindful of your emotional, social, and spiritual needs. . Meet regularly with a Primary Care Provider (PCP). Find a PCP if you do not already have one. . Talk to your doctor about regular cancer screening.

## 2015-05-11 NOTE — Progress Notes (Signed)
CLINIC:  Cancer Survivorship   REASON FOR VISIT:  Routine follow-up post-treatment for history of breast cancer.  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, ER/PR+ treated with chemotherapy followed by autologous stem cell transplant at [facility], followed by radiation, followed by letrozole   09/13/2005 Surgery S/P right simple mastectomy for Stage IA (mpT1bN0) invasive lobular breast cancer, grade 2, estrogen receptor 95% positive, progesterone receptor and HER-2 negative, with an Ki67 of 19%   2007 - 2013 Anti-estrogen oral therapy Exemestane x 6-7 years   01/01/2013 Surgery S/P left mastectomy and axillary node sampling for atypical lobular hyperplasia (no malignancy)   09/19/2013 Surgery S/P bilateral salpingo-oophorectomy with benign pathology    INTERVAL HISTORY:  Anna Frank presents to the [facility] today for ongoing follow up regarding her breast cancer. Anna Frank has a history of Stage IIIA (T2N2) invasive lobular carcinoma of the right breast, ER/PR+ that was treated as outlined above.  She has been followed since that time on a program of surveillance.  Overall, she states that she is doing well.  Her major complaint centers around a rash along the upper part of her back that has developed since her BSO in March 2015.  She states that Dr. Helane Rima believes that it is related to her loss of hormones following the surgery.  She saw her dermatologist this AM who gave her a steroid injection for the itching.  She denies any rash along other areas of her body.  She has had no change along either of her mastectomy incisions.  She reports a good appetite and denies weight loss.  She has a nonproductive cough, which she has had for quite some time, and has undergone previous evaluation for it by pulmonology with no  etiology identified. Her only shortness of breath occurs with walking up steps and resolves with rest. She has had no headache, pain or fatigue.   REVIEW OF SYSTEMS:  General: Denies fever, chills, or night sweats. HEENT: Wears glasses. Occasional runny nose and small ear drainage in past. Denies visual changes, hearing loss, mouth sores, or difficulty swallowing. Cardiac: Denies palpitations, chest pain, and lower extremity edema.  Respiratory: Cough and DOE (steps) as above.  Breast: As above. S/P bilateral mastectomy. GI: Denies abdominal pain, constipation, diarrhea, nausea, or vomiting.  GU: Denies dysuria, hematuria, vaginal bleeding, vaginal discharge, or vaginal dryness.  Musculoskeletal: Denies joint or bone pain.  Neuro: Denies headache or recent falls. Denies peripheral neuropathy. Skin: Rash and itching as above.  Psych: Denies depression, anxiety, insomnia, or memory loss.   A 14-point review of systems was completed and was negative, except as noted above.   ONCOLOGY TREATMENT TEAM:  1. Surgeon:  Dr. Dalbert Batman at [facility]  2. Medical Oncologist: Dr. Jana Hakim     PAST MEDICAL/SURGICAL HISTORY:  Past Medical History  Diagnosis Date  . 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 Hypertension   . Hyperlipemia   . Family history of anesthesia complication     "one sister has PONV" (01/01/2013)  . Breast cancer ([place]) 1998    right  . Breast cancer ([place]) 01/01/2013    left  . Type II diabetes mellitus ([place])   . PONV (postoperative nausea and vomiting)   . Stroke [facility]) 1998    denies residual  on 01/01/2013   Past Surgical History  Procedure Laterality Date  . Mastectomy Right 2007  . Mastectomy complete / simple Left 01/01/2013  . Tonsillectomy  ~ 1954  . Breast lumpectomy  1998  . Breast biopsy Bilateral     "lots over the years" (01/01/2013)  . 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 Total mastectomy Left  01/01/2013    Procedure: TOTAL MASTECTOMY;  Surgeon: Adin Hector, MD;  Location: [facility];  Service: General;  Laterality: Left;  . Laparoscopy N/A 09/19/2013    Procedure: LAPAROSCOPIC  BILATERAL SALPINGO OOPHORECTOMY WITH WASHINGS;  Surgeon: Cyril Mourning, MD;  Location: [facility];  Service: Gynecology;  Laterality: N/A;     ALLERGIES:  Allergies  Allergen Reactions  . Citric Acid Other (See Comments)    Suggested by MD  . Tryptophan Other (See Comments)    Affects bladder.  . Tyrosine     Urinary irritation     CURRENT MEDICATIONS:  Current Outpatient Prescriptions on File Prior to Visit  Medication Sig Dispense Refill  . aspirin 81 MG tablet Take 81 mg by mouth daily.    Marland Kitchen b complex vitamins tablet Take 1 tablet by mouth daily.    Marland Kitchen glimepiride (AMARYL) 2 MG tablet Take 1 1/2 tablets by mouth once daily as directed    . HYDROcodone-acetaminophen (NORCO) 10-325 MG per tablet Take 1 tablet by mouth every 6 (six) hours as needed. 30 tablet 0  . levothyroxine (SYNTHROID, LEVOTHROID) 50 MCG tablet Take 50 mcg by mouth daily before breakfast.    . metFORMIN (GLUCOPHAGE) 1000 MG tablet Take 1,000 mg by mouth 2 (two) times daily with a meal.    . simvastatin (ZOCOR) 20 MG tablet Take 40 mg by mouth every evening.     . vitamin C (ASCORBIC ACID) 500 MG tablet Take 500 mg by mouth daily.    . vitamin E 1000 UNIT capsule Take 1,000 Units by mouth daily.    . fish oil-omega-3 fatty acids 1000 MG capsule Take 1 g by mouth daily.    . Insulin Glargine (LANTUS) 100 UNIT/ML Solostar Pen Inject into the skin.     No current facility-administered medications on file prior to visit.     ONCOLOGIC FAMILY HISTORY:  Family History  Problem Relation Age of Onset  . Heart disease Mother   . Cancer Sister     ovarian  . Kidney cancer Sister   . Prostate cancer Paternal Uncle   . Prostate cancer Paternal Grandfather   . Breast cancer Cousin     4 paternal cousins with breast cancer in thier  44s    SOCIAL HISTORY:  Anna Frank is married and lives with her spouse in [place], [place].  She has no children. 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  She is a former smoker, having quit in 1998.  She rarely uses alcohol and denies any illicit drugs.    PHYSICAL EXAMINATION:  Vital Signs:   Filed Vitals:   05/11/15 1327  BP: 119/61  Pulse: 82  Temp: 98.9 F (37.2 C)  Resp: 18   ECOG performance status: 0 General: Well-nourished, well-appearing female in no acute distress.  She is accompanied in clinic by her husband today.   HEENT: Head is atraumatic and normocephalic.  Conjunctivae clear without exudate.  Sclerae anicteric. Oral mucosa is pink, moist, and intact without lesions.   Lymph: No  cervical, supraclavicular, infraclavicular, or axillary lymphadenopathy noted on palpation.  Cardiovascular: Regular rate and rhythm without murmurs, rubs, or gallops. Respiratory: Clear to auscultation bilaterally. Chest expansion symmetric without accessory muscle use on inspiration or expiration.  Breast: Bilateral mastectomy scars intact without mass or nodularity. GI: Abdomen soft and round. No tenderness to palpation. Bowel sounds normoactive in 4 quadrants.  GU: Deferred.  Musculoskeletal: Muscle strength 5/5 in all extremities.   Neuro: No focal deficits. Steady gait.  Psych: Mood and affect normal and appropriate for situation.  Extremities: No edema, cyanosis, or clubbing.  Skin: Scattered lesions over back.  Non-raised, drug, areas of excoriation secondary to scratching noted.    LABORATORY DATA:  No results found for this or any previous visit (from the past 2160 hour(s)).  DIAGNOSTIC IMAGING: None     ASSESSMENT AND PLAN:   1. Breast cancer: History of a stage IIIA invasive lobular carcinoma of the right breast, ER/PR+, S/P bilateral mastectomy, high dose chemotherapy followed by autologous stem cell  transplant at [facility] in [facility], followed by radiation and adjuvant letrozole, now followed in a program of surveillance. Anna Frank is doing well with no clinical symptoms worrisome for cancer recurrence at this time. I have reviewed the recommendations for ongoing surveillance with her and she will follow-up with her medical oncologist,  Dr. Jana Hakim, in January 2017with history and physical exam  per surveillance protocol.  She will return to see me in one year's time alternating with Dr. Jana Hakim.  She was instructed to make Dr. Jana Hakim or myself aware if she notes any change along her mastectomy incision or any new symptoms such as pain, worsen / changing shortness of breath, weight loss, or fatigue.  She has the names of the chemotherapy medications she received as part of her transplant at home and will route those to [place] following her appointment today. I do not believe her rash to be related to her history of breast cancer.  2. Cancer screening:  Due to Anna Frank's history and her age, she should receive screening for skin cancers, colon cancer, and gynecologic cancers.  The information and recommendations are listed on the patient's comprehensive care plan/treatment summary as part of her wellness recommendations.   3. Health maintenance and wellness promotion: Anna Frank was encouraged to consume 5-7 servings of fruits and vegetables per day. She was also encouraged to engage in moderate to vigorous exercise for 30 minutes per day most days of the week. She was instructed to limit her alcohol consumption and continue to abstain from tobacco use as part of her wellness recommendations.     A total of 30 minutes of face-to-face time was spent with this patient with greater than 50% of that time in counseling and care-coordination.   Sylvan Cheese, NP  Survivorship Program [facility] [phone]   Note: PRIMARY CARE PROVIDER Idelle Crouch,  [facility] [phone]

## 2015-05-11 NOTE — Telephone Encounter (Signed)
Called and left a message with [employer] appt

## 2015-06-03 ENCOUNTER — Encounter: Payer: Medicare Other | Admitting: Nurse Practitioner

## 2015-07-22 ENCOUNTER — Other Ambulatory Visit: Payer: Self-pay | Admitting: *Deleted

## 2015-07-22 DIAGNOSIS — C50411 Malignant neoplasm of upper-outer quadrant of right female breast: Secondary | ICD-10-CM

## 2015-07-22 DIAGNOSIS — N6099 Unspecified benign mammary dysplasia of unspecified breast: Secondary | ICD-10-CM

## 2015-07-23 ENCOUNTER — Other Ambulatory Visit (HOSPITAL_BASED_OUTPATIENT_CLINIC_OR_DEPARTMENT_OTHER): Payer: Medicare Other

## 2015-07-23 DIAGNOSIS — N6099 Unspecified benign mammary dysplasia of unspecified breast: Secondary | ICD-10-CM

## 2015-07-23 DIAGNOSIS — Z853 Personal history of malignant neoplasm of breast: Secondary | ICD-10-CM | POA: Diagnosis not present

## 2015-07-23 DIAGNOSIS — C50411 Malignant neoplasm of upper-outer quadrant of right female breast: Secondary | ICD-10-CM

## 2015-07-23 LAB — CBC WITH DIFFERENTIAL/PLATELET
BASO%: 0.8 % (ref 0.0–2.0)
BASOS ABS: 0.1 10*3/uL (ref 0.0–0.1)
EOS%: 2.5 % (ref 0.0–7.0)
Eosinophils Absolute: 0.2 10*3/uL (ref 0.0–0.5)
HCT: 33.6 % — ABNORMAL LOW (ref 34.8–46.6)
HGB: 10.3 g/dL — ABNORMAL LOW (ref 11.6–15.9)
LYMPH%: 25.2 % (ref 14.0–49.7)
MCH: 22 pg — AB (ref 25.1–34.0)
MCHC: 30.7 g/dL — ABNORMAL LOW (ref 31.5–36.0)
MCV: 71.8 fL — ABNORMAL LOW (ref 79.5–101.0)
MONO#: 0.7 10*3/uL (ref 0.1–0.9)
MONO%: 8.8 % (ref 0.0–14.0)
NEUT#: 5.2 10*3/uL (ref 1.5–6.5)
NEUT%: 62.7 % (ref 38.4–76.8)
Platelets: 282 10*3/uL (ref 145–400)
RBC: 4.68 10*6/uL (ref 3.70–5.45)
RDW: 16 % — ABNORMAL HIGH (ref 11.2–14.5)
WBC: 8.3 10*3/uL (ref 3.9–10.3)
lymph#: 2.1 10*3/uL (ref 0.9–3.3)

## 2015-07-23 LAB — COMPREHENSIVE METABOLIC PANEL
ALT: 17 U/L (ref 0–55)
AST: 19 U/L (ref 5–34)
Albumin: 4.1 g/dL (ref 3.5–5.0)
Alkaline Phosphatase: 61 U/L (ref 40–150)
Anion Gap: 12 mEq/L — ABNORMAL HIGH (ref 3–11)
BUN: 9.7 mg/dL (ref 7.0–26.0)
CHLORIDE: 104 meq/L (ref 98–109)
CO2: 22 mEq/L (ref 22–29)
Calcium: 9.1 mg/dL (ref 8.4–10.4)
Creatinine: 0.8 mg/dL (ref 0.6–1.1)
EGFR: 76 mL/min/{1.73_m2} — AB (ref >90)
GLUCOSE: 154 mg/dL — AB (ref 70–140)
POTASSIUM: 4.1 meq/L (ref 3.5–5.1)
SODIUM: 138 meq/L (ref 136–145)
Total Bilirubin: 0.65 mg/dL (ref 0.20–1.20)
Total Protein: 6.7 g/dL (ref 6.4–8.3)

## 2015-07-30 ENCOUNTER — Ambulatory Visit (HOSPITAL_BASED_OUTPATIENT_CLINIC_OR_DEPARTMENT_OTHER): Payer: Medicare Other | Admitting: Oncology

## 2015-07-30 VITALS — BP 144/81 | HR 81 | Resp 18 | Ht 62.5 in | Wt 149.0 lb

## 2015-07-30 DIAGNOSIS — Z853 Personal history of malignant neoplasm of breast: Secondary | ICD-10-CM

## 2015-07-30 DIAGNOSIS — C50411 Malignant neoplasm of upper-outer quadrant of right female breast: Secondary | ICD-10-CM

## 2015-07-30 NOTE — Progress Notes (Signed)
ID: [name] B Clyatt OB: [birth year]  MR#: [id number]  YKZ#:[id number]  PCP: Anna Crouch, MD GYN:  Anna Frank  SU: Anna Frank OTHER MD: Anna Frank  CHIEF COMPLAINT: Bilateral breast cancer  CURRENT TREATMENT: Observation   HISTORY OF PRESENT ILLNESS: From the original intake note:  [employer]'s history goes all the way back to 1998, when she had a right upper-outer quadrant breast biopsy for an estrogen receptor positive breast cancer. She underwent a right lumpectomy and axillary lymph node dissection under Anna Frank for a pT2 pN2, stage IIIA invasive lobular breast cancer, treated adjuvantly with high-dose chemotherapy and autologous transplantation at [facility]. She then received adjuvant radiation followed by adjuvant letrozole for 5 years.  In 2007 she was found to have a local recurrence and underwent simple right mastectomy 09/13/2005 for an mpT1b NX, stage IA invasive lobular breast cancer, grade 2, estrogen receptor 95% positive, progesterone receptor and HER-2 negative, with an MIB-1 of 19%. She was started on exemestane and this was continued on until March of 1994.  More recently she had multiple biopsies of the left breast, nonmalignant, leading to a final decision for left lumpectomy and x-ray lymph node sampling 01/01/2013 under Anna Frank. This showed only atypical lobular hyperplasia.  Her subsequent history is as detailed below  INTERVAL HISTORY: Anna Frank returns today for followup of her right-sided breast cancer. Interval history is generally unremarkable. She feels she is doing "okay" and in particular her sugars are much better controlled, she tells me. She has occasional leg cramps at night, and little bit of sinus an allergy symptoms this time of year , and mild anxiety chiefly relating to her husband's problems.  REVIEW OF SYSTEMS:  a detailed review of systems today was stable otherwise and as noted above  PAST MEDICAL  HISTORY: Past Medical History  Diagnosis Date  . 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 Hypertension   . Hyperlipemia   . Family history of anesthesia complication     "one sister has PONV" (01/01/2013)  . Breast cancer ([place]) 1998    right  . Breast cancer ([place]) 01/01/2013    left  . Type II diabetes mellitus ([place])   . PONV (postoperative nausea and vomiting)   . Stroke [facility]) 1998    denies residual on 01/01/2013    PAST SURGICAL HISTORY: Past Surgical History  Procedure Laterality Date  . Mastectomy Right 2007  . Mastectomy complete / simple Left 01/01/2013  . Tonsillectomy  ~ 1954  . Breast lumpectomy  1998  . Breast biopsy Bilateral     "lots over the years" (01/01/2013)  . 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 Total mastectomy Left 01/01/2013    Procedure: TOTAL MASTECTOMY;  Surgeon: Anna Hector, MD;  Location: [place];  Service: General;  Laterality: Left;  . Laparoscopy N/A 09/19/2013    Procedure: LAPAROSCOPIC  BILATERAL SALPINGO OOPHORECTOMY WITH WASHINGS;  Surgeon: Anna Mourning, MD;  Location: [facility];  Service: Gynecology;  Laterality: N/A;    FAMILY HISTORY Family History  Problem Relation Age of Onset  . Heart disease Mother   . Cancer Sister     ovarian  . Kidney cancer Sister   . Prostate cancer Paternal Uncle   . Prostate cancer Paternal Grandfather   . Breast cancer Cousin     4 paternal cousins with breast cancer in thier 58s  The patient's father died at the  age of 77. The patient's mother died at the age of 71. The patient had one brother, 3 sisters. One sister was diagnosed with ovarian cancer at the age of 15. The patient's family history is well documented in our genetics counseling node from 07/04/2012, at which time testing did term and the patient did not carry a BRCA mutation  GYNECOLOGIC HISTORY:  Menarche age 78, the patient never carried a child to term (she became postmenopausal with chemotherapy  in 1998). She took birth control between 53 in 2725 with no complications. 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 Anna Frank is retired from [employer].    ADVANCED DIRECTIVES: In place   HEALTH MAINTENANCE: Social History  Substance Use Topics  . Smoking status: Former Smoker -- 1.00 packs/day for 20 years    Types: Cigarettes    Quit date: 08/18/1996  . Smokeless tobacco: Never Used  . Alcohol Use: Yes     Comment: 01/01/2013 "4oz glass of wine when I drink; none for 3-4 weeks"     Colonoscopy: Feb 2015, negative  PAP:  Bone density: 04/17/2014 at [facility]/T score -1.1  Lipid panel:  Allergies  Allergen Reactions  . Citric Acid Other (See Comments)    Suggested by MD  . Tryptophan Other (See Comments)    Affects bladder.  . Tyrosine     Urinary irritation    Current Outpatient Prescriptions  Medication Sig Dispense Refill  . aspirin 81 MG tablet Take 81 mg by mouth daily.    Marland Kitchen b complex vitamins tablet Take 1 tablet by mouth daily.    . fish oil-omega-3 fatty acids 1000 MG capsule Take 1 g by mouth daily.    Marland Kitchen glimepiride (AMARYL) 2 MG tablet Take 1 1/2 tablets by mouth once daily as directed    . HYDROcodone-acetaminophen (NORCO) 10-325 MG per tablet Take 1 tablet by mouth every 6 (six) hours as needed. 30 tablet 0  . Insulin Glargine (LANTUS) 100 UNIT/ML Solostar Pen Inject into the skin.    Marland Kitchen insulin lispro protamine-lispro (HUMALOG 75/25 MIX) (75-25) 100 UNIT/ML SUSP injection Inject into the skin.    Marland Kitchen levothyroxine (SYNTHROID, LEVOTHROID) 50 MCG tablet Take 50 mcg by mouth daily before breakfast.    . losartan (COZAAR) 100 MG tablet Take 100 mg by mouth daily.    . metFORMIN (GLUCOPHAGE) 1000 MG tablet Take 1,000 mg by mouth 2 (two) times daily with a meal.    . simvastatin (ZOCOR) 20 MG tablet Take 40 mg by mouth every evening.     . vitamin C (ASCORBIC ACID) 500 MG tablet Take 500 mg  by mouth daily.    . vitamin E 1000 UNIT capsule Take 1,000 Units by mouth daily.     No current facility-administered medications for this visit.    OBJECTIVE: Middle-aged white woman who appears stated age 70 Vitals:   07/30/15 1351  BP: 144/81  Pulse: 81  Resp: 18     Body mass index is 26.8 kg/(m^2).    ECOG FS:0 - Asymptomatic  Sclerae unicteric, EOMs intact Oropharynx clear and moist No cervical or supraclavicular adenopathy Lungs no rales or rhonchi Heart regular rate and rhythm Abd soft, nontender, positive bowel sounds MSK no focal spinal tenderness, no upper extremity lymphedema Neuro: nonfocal, well oriented, appropriate affect Breasts:  Status post bilateral mastectomies. There are some telangiectasias but there is no evidence of chest wall  recurrence. Both axillae are benign.  LAB RESULTS:  CMP     Component Value Date/Time   NA 138 07/23/2015 1359   NA 139 09/17/2013 1210   NA 137 01/18/2013 0549   NA 139 12/03/2008 1046   K 4.1 07/23/2015 1359   K 3.9 09/17/2013 1210   K 3.5 01/18/2013 0549   K 4.0 12/03/2008 1046   CL 98 09/17/2013 1210   CL 105 01/18/2013 0549   CL 101 12/03/2008 1046   CO2 22 07/23/2015 1359   CO2 28 09/17/2013 1210   CO2 24 01/18/2013 0549   CO2 29 12/03/2008 1046   GLUCOSE 154* 07/23/2015 1359   GLUCOSE 152* 09/17/2013 1210   GLUCOSE 133* 01/18/2013 0549   GLUCOSE 169* 12/03/2008 1046   BUN 9.7 07/23/2015 1359   BUN 8 09/17/2013 1210   BUN 8 01/18/2013 0549   BUN 13 12/03/2008 1046   CREATININE 0.8 07/23/2015 1359   CREATININE 0.61 09/17/2013 1210   CREATININE 0.71 01/18/2013 0549   CREATININE 0.8 12/03/2008 1046   CALCIUM 9.1 07/23/2015 1359   CALCIUM 9.6 09/17/2013 1210   CALCIUM 8.3* 01/18/2013 0549   CALCIUM 9.4 12/03/2008 1046   PROT 6.7 07/23/2015 1359   PROT 7.2 09/17/2013 1210   PROT 6.8 01/17/2013 1631   PROT 7.2 12/03/2008 1046   ALBUMIN 4.1 07/23/2015 1359   ALBUMIN 4.4 09/17/2013 1210   ALBUMIN 3.5  01/17/2013 1631   ALBUMIN 3.9 12/03/2008 1046   AST 19 07/23/2015 1359   AST 22 09/17/2013 1210   AST 17 01/17/2013 1631   AST 28 12/03/2008 1046   ALT 17 07/23/2015 1359   ALT 20 09/17/2013 1210   ALT 18 01/17/2013 1631   ALT 29 12/03/2008 1046   ALKPHOS 61 07/23/2015 1359   ALKPHOS 61 09/17/2013 1210   ALKPHOS 72 01/17/2013 1631   ALKPHOS 61 12/03/2008 1046   BILITOT 0.65 07/23/2015 1359   BILITOT 0.5 09/17/2013 1210   BILITOT 0.9 01/17/2013 1631   BILITOT 0.90 12/03/2008 1046   GFRNONAA >90 09/17/2013 1210   GFRNONAA >60 01/18/2013 0549   GFRAA >90 09/17/2013 1210   GFRAA >60 01/18/2013 0549    I No results found for: SPEP  Lab Results  Component Value Date   WBC 8.3 07/23/2015   NEUTROABS 5.2 07/23/2015   HGB 10.3* 07/23/2015   HCT 33.6* 07/23/2015   MCV 71.8* 07/23/2015   PLT 282 07/23/2015      Chemistry      Component Value Date/Time   NA 138 07/23/2015 1359   NA 139 09/17/2013 1210   NA 137 01/18/2013 0549   NA 139 12/03/2008 1046   K 4.1 07/23/2015 1359   K 3.9 09/17/2013 1210   K 3.5 01/18/2013 0549   K 4.0 12/03/2008 1046   CL 98 09/17/2013 1210   CL 105 01/18/2013 0549   CL 101 12/03/2008 1046   CO2 22 07/23/2015 1359   CO2 28 09/17/2013 1210   CO2 24 01/18/2013 0549   CO2 29 12/03/2008 1046   BUN 9.7 07/23/2015 1359   BUN 8 09/17/2013 1210   BUN 8 01/18/2013 0549   BUN 13 12/03/2008 1046   CREATININE 0.8 07/23/2015 1359   CREATININE 0.61 09/17/2013 1210   CREATININE 0.71 01/18/2013 0549   CREATININE 0.8 12/03/2008 1046      Component Value Date/Time   CALCIUM 9.1 07/23/2015 1359   CALCIUM 9.6 09/17/2013 1210   CALCIUM 8.3* 01/18/2013 0549   CALCIUM 9.4  12/03/2008 1046   ALKPHOS 61 07/23/2015 1359   ALKPHOS 61 09/17/2013 1210   ALKPHOS 72 01/17/2013 1631   ALKPHOS 61 12/03/2008 1046   AST 19 07/23/2015 1359   AST 22 09/17/2013 1210   AST 17 01/17/2013 1631   AST 28 12/03/2008 1046   ALT 17 07/23/2015 1359   ALT 20 09/17/2013  1210   ALT 18 01/17/2013 1631   ALT 29 12/03/2008 1046   BILITOT 0.65 07/23/2015 1359   BILITOT 0.5 09/17/2013 1210   BILITOT 0.9 01/17/2013 1631   BILITOT 0.90 12/03/2008 1046       Lab Results  Component Value Date   LABCA2 23 12/03/2008    No components found for: XOVAN191  No results for input(s): INR in the last 168 hours.  Urinalysis    Component Value Date/Time   COLORURINE Yellow 01/17/2013 1631   COLORURINE AMBER* 12/27/2012 1437   APPEARANCEUR Hazy 01/17/2013 1631   APPEARANCEUR CLEAR 12/27/2012 1437   LABSPEC 1.026 01/17/2013 1631   LABSPEC 1.031* 12/27/2012 1437   PHURINE 5.0 01/17/2013 1631   PHURINE 5.0 12/27/2012 1437   GLUCOSEU >=500 01/17/2013 1631   GLUCOSEU NEGATIVE 12/27/2012 1437   HGBUR Negative 01/17/2013 1631   HGBUR NEGATIVE 12/27/2012 1437   BILIRUBINUR Negative 01/17/2013 1631   BILIRUBINUR NEGATIVE 12/27/2012 1437   KETONESUR 1+ 01/17/2013 1631   KETONESUR 15* 12/27/2012 1437   PROTEINUR 30 mg/dL 01/17/2013 1631   PROTEINUR NEGATIVE 12/27/2012 1437   UROBILINOGEN 0.2 12/27/2012 1437   NITRITE Negative 01/17/2013 1631   NITRITE NEGATIVE 12/27/2012 1437   LEUKOCYTESUR Negative 01/17/2013 1631   LEUKOCYTESUR NEGATIVE 12/27/2012 1437    STUDIES: No results found.   ASSESSMENT: 70 y.o. BRCA negative [place], [place] woman  (1) s/p Right lumpectomy and ALND 1998 for a T2 N2, stage IIIA invasive lobular breast cancer, estrogen and progesterone receptor positive, treated with chemotherapy followed by autologous stem cell transplant at [facility], followed by radiation, followed by letrozole x 5 years  (2) s/p Right simple mastectomy  09/13/2005 for an mpT1b invasive lobular breast cancer, grade 2, estrogen receptor 95% positive, progesterone receptor and HER-2 negative, with an Mib-1 of 19%, treated adjuvantly with exemestane for 6-7 years  (3) s/p Left mastectomy and axillary node sampling 01/01/2013 for atypical lobular hyperplasia (no  malignancy)  (4) Status post bilateral salpingo-oophorectomy 09/19/2013, with benign pathology ([id number])  (5) status post shave removal of a superficial and nodular basal cell carcinoma from the medial left ankle skin  PLAN: Anna Frank is now just about 10 years out from her right mastectomy , with no evidence of disease recurrence. This is very favorable.  Of course all she had in her left breast was atypical lobular hyperplasia, which is not malignant in itself. That was taken care of with the surgery.    all she needs in terms of further follow-up is a yearly physician chest wall exam.  Accordingly I am comfortable releasing her to her primary care physician. She has excellent and frequent follow-up because of her diabetes problems.  She is comfortable with this approach. She knows that we will be glad to see her again if and when any cancer related problem develops. As of now however we are making no further routine appointments for her here.  Chauncey Cruel, MD   07/30/2015 2:04 PM

## 2015-08-27 ENCOUNTER — Telehealth: Payer: Self-pay | Admitting: *Deleted

## 2015-08-27 NOTE — Telephone Encounter (Signed)
"  I need a copy of my labs faxed to me."  Call transferred to [facility] Ext. [phone].  Voicemail received.

## 2015-09-08 ENCOUNTER — Telehealth: Payer: Self-pay | Admitting: *Deleted

## 2015-09-08 NOTE — Telephone Encounter (Signed)
This RN spoke with patient per her call inquiring about lab results that she obtained from my chart showing abnormals. " but at my last visit Dr Anna Frank released me from follow up for my history of breast cancer "  Per review noted abnormal are related to heme globin, hct, as well as differential relating to possible iron deficient anemia.  Per discussion pt denies any rectal bleeding. Last colonoscopy was approximately 2 years ago.  She states she has noticed increased fatigue.  Anna Frank stated concern for abnormals due to " I had a stem cell transplant in 1998 for breast cancer and then a second breast cancer in 2008 "  Lab concerns reviewed with MD who states patient should obtain work up for anemia with labs : CBC Ferritin tretic count guaiac cards.  This RN spoke with pt post MD review and informed her MD request for work up for anemia.  Per discussion pt would like to obtain labs and follow up for anemia with her primary MD Dr Anna Frank at the [facility] in [place].  This RN informed pt lab results and above request will be sent to his office for appointment and follow up.  No other needs at this time.

## 2015-10-16 ENCOUNTER — Encounter: Payer: Self-pay | Admitting: Adult Health

## 2015-10-16 NOTE — Progress Notes (Signed)
A birthday card was mailed to the patient today on behalf of the Survivorship Program at [facility].   Quintrell Baze, NP Survivorship Program [facility] [phone]  

## 2016-04-08 ENCOUNTER — Telehealth: Payer: Self-pay | Admitting: Adult Health

## 2016-04-08 NOTE — Telephone Encounter (Signed)
I received a transferred call from [name] stating that she did not understand why she was scheduled to see me "when Dr. Jana Hakim let me go."  I explained that I am our survivorship nurse practitioner and often see women who are doing well from their cancer standpoint and can continue to be seen at the cancer center annually for exams in the survivorship clinic.    She states that she is doing fine and is not interested in being seen in survivorship.  She would like for me to cancel her appointment with me for 04/2016, which I have done.  I encouraged her to maintain her follow-up visits with her PCP and to get her mammogram every year.  She stated, "Oh, I take really good care of myself and I do all of that."   Encouraged her to call us in the future if she needs [place]. No further appts to be scheduled at the cancer center at this time per patient wishes.   Mike Craze, NP [place] [phone]

## 2016-05-10 ENCOUNTER — Encounter: Payer: Medicare Other | Admitting: Nurse Practitioner

## 2016-05-12 ENCOUNTER — Encounter: Payer: Medicare Other | Admitting: Adult Health

## 2017-12-01 ENCOUNTER — Other Ambulatory Visit: Payer: Self-pay

## 2018-08-23 ENCOUNTER — Other Ambulatory Visit: Payer: Self-pay | Admitting: Internal Medicine

## 2018-08-23 DIAGNOSIS — R911 Solitary pulmonary nodule: Secondary | ICD-10-CM

## 2018-08-24 ENCOUNTER — Ambulatory Visit
Admission: RE | Admit: 2018-08-24 | Discharge: 2018-08-24 | Disposition: A | Discharge status: Home / Self Care | Payer: Medicare Other | Source: Ambulatory Visit | Attending: Internal Medicine | Admitting: Internal Medicine

## 2018-08-24 DIAGNOSIS — R911 Solitary pulmonary nodule: Secondary | ICD-10-CM | POA: Insufficient documentation

## 2018-09-24 ENCOUNTER — Other Ambulatory Visit: Payer: Self-pay | Admitting: Internal Medicine

## 2018-09-24 DIAGNOSIS — R911 Solitary pulmonary nodule: Secondary | ICD-10-CM

## 2018-11-23 ENCOUNTER — Ambulatory Visit: Payer: Medicare Other

## 2018-12-24 ENCOUNTER — Other Ambulatory Visit: Payer: Self-pay

## 2018-12-24 ENCOUNTER — Ambulatory Visit
Admission: RE | Admit: 2018-12-24 | Discharge: 2018-12-24 | Disposition: A | Discharge status: Home / Self Care | Payer: Medicare Other | Source: Ambulatory Visit | Attending: Internal Medicine | Admitting: Internal Medicine

## 2018-12-24 DIAGNOSIS — R911 Solitary pulmonary nodule: Secondary | ICD-10-CM | POA: Diagnosis present

## 2018-12-28 ENCOUNTER — Other Ambulatory Visit: Payer: Self-pay | Admitting: Internal Medicine

## 2018-12-28 DIAGNOSIS — R911 Solitary pulmonary nodule: Secondary | ICD-10-CM

## 2019-01-07 ENCOUNTER — Encounter
Admission: RE | Admit: 2019-01-07 | Discharge: 2019-01-07 | Disposition: A | Discharge status: Home / Self Care | Payer: Medicare Other | Source: Ambulatory Visit | Attending: Internal Medicine | Admitting: Internal Medicine

## 2019-01-07 ENCOUNTER — Other Ambulatory Visit: Payer: Self-pay | Admitting: Internal Medicine

## 2019-01-07 ENCOUNTER — Other Ambulatory Visit: Payer: Self-pay

## 2019-01-07 DIAGNOSIS — R911 Solitary pulmonary nodule: Secondary | ICD-10-CM | POA: Diagnosis present

## 2019-01-07 DIAGNOSIS — I2510 Atherosclerotic heart disease of native coronary artery without angina pectoris: Secondary | ICD-10-CM | POA: Diagnosis not present

## 2019-01-07 LAB — GLUCOSE, CAPILLARY: Glucose-Capillary: 136 mg/dL — ABNORMAL HIGH (ref 70–99)

## 2019-01-07 MED ORDER — FLUDEOXYGLUCOSE F - 18 (FDG) INJECTION
7.7000 | Freq: Once | INTRAVENOUS | Status: AC | PRN
  Administered 2019-01-07: 8.04 via INTRAVENOUS

## 2019-02-01 ENCOUNTER — Other Ambulatory Visit: Payer: Self-pay

## 2019-02-01 ENCOUNTER — Ambulatory Visit (INDEPENDENT_AMBULATORY_CARE_PROVIDER_SITE_OTHER): Payer: Medicare Other | Admitting: Cardiothoracic Surgery

## 2019-02-01 ENCOUNTER — Encounter: Payer: Self-pay | Admitting: Cardiothoracic Surgery

## 2019-02-01 VITALS — BP 166/90 | HR 78 | Temp 97.9°F | Ht 62.0 in | Wt 142.0 lb

## 2019-02-01 DIAGNOSIS — J984 Other disorders of lung: Secondary | ICD-10-CM

## 2019-02-01 NOTE — Progress Notes (Signed)
Patient ID: Anna Frank, female   DOB: 1945/11/16, 73 y.o.   MRN: [id number]  Chief Complaint  Patient presents with  . New Patient (Initial Visit)    lung nodule    Referred By Dr. Fulton Reek Reason for Referral right middle lobe mass  HPI Location, Quality, Duration, Severity, Timing, Context, Modifying Factors, Associated Signs and Symptoms.  Anna Frank is a 73 y.o. female.  This patient is a 73 year old woman with a somewhat complicated past medical history including bilateral breast surgeries for a right breast carcinoma which was treated with radiation therapy chemotherapy surgery and a stem cell transplant back in the late 1990s.  At that time she was a smoker but she subsequently quit.  She also had a stroke around the same timeframe and decided that she would stop smoking at that point.  She had a CT scan of the chest done back in February and done in June which revealed an enlarging right middle lobe mass.  She then had a PET scan done which revealed hypermetabolism in the nodule but no evidence of distant disease.  It is presumed that she has a stage I carcinoma of the lung.  She has not smoked in over 20 years.  She states that she is very active.  She has had no hemoptysis cough or fevers.  She denies any weight loss.  There is no family history of lung cancer.   Past Medical History:  Diagnosis Date  . Blood transfusion 1998   "stem cell related" (01/01/2013)  . Breast cancer ([place]) 1998   right  . Breast cancer ([place]) 01/01/2013   left  . Family history of anesthesia complication    "one sister has PONV" (01/01/2013)  . Hyperlipemia   . Hypertension   . PONV (postoperative nausea and vomiting)   . Stroke [facility]) 1998   denies residual on 01/01/2013  . Type II diabetes mellitus ([place])     Past Surgical History:  Procedure Laterality Date  . BONE MARROW TRANSPLANT  1998   "stem cell" (01/01/2013)  . BREAST BIOPSY Bilateral    "lots over the years" (01/01/2013)   . BREAST LUMPECTOMY  1998  . LAPAROSCOPY N/A 09/19/2013   Procedure: LAPAROSCOPIC  BILATERAL SALPINGO OOPHORECTOMY WITH WASHINGS;  Surgeon: Cyril Mourning, MD;  Location: [facility];  Service: Gynecology;  Laterality: N/A;  . MASTECTOMY Right 2007  . MASTECTOMY COMPLETE / SIMPLE Left 01/01/2013  . TONSILLECTOMY  ~ 1954  . TOTAL MASTECTOMY Left 01/01/2013   Procedure: TOTAL MASTECTOMY;  Surgeon: Adin Hector, MD;  Location: [facility];  Service: General;  Laterality: Left;    Family History  Problem Relation Age of Onset  . Heart disease Mother   . Cancer Sister        ovarian  . Kidney cancer Sister   . Prostate cancer Paternal Uncle   . Prostate cancer Paternal Grandfather   . Breast cancer Cousin        4 paternal cousins with breast cancer in thier 29s    Social History Social History   Tobacco Use  . Smoking status: Former Smoker    Packs/day: 1.00    Years: 20.00    Pack years: 20.00    Types: Cigarettes    Quit date: 08/18/1996    Years since quitting: 22.4  . Smokeless tobacco: Never Used  Substance Use Topics  . Alcohol use: Yes    Comment: 01/01/2013 "4oz glass of wine when I drink; none  for 3-4 weeks"  . Drug use: No    Allergies  Allergen Reactions  . Citric Acid Other (See Comments)    Suggested by MD  . Tryptophan Other (See Comments)    Affects bladder.  . Tyrosine     Urinary irritation    Current Outpatient Medications  Medication Sig Dispense Refill  . aspirin 81 MG tablet Take 81 mg by mouth daily.    Marland Kitchen b complex vitamins tablet Take 1 tablet by mouth daily.    . fish oil-omega-3 fatty acids 1000 MG capsule Take 1 g by mouth daily.    Marland Kitchen HYDROcodone-acetaminophen (NORCO) 10-325 MG per tablet Take 1 tablet by mouth every 6 (six) hours as needed. 30 tablet 0  . losartan (COZAAR) 100 MG tablet Take 100 mg by mouth daily.    . metFORMIN (GLUCOPHAGE) 1000 MG tablet Take 1,000 mg by mouth 2 (two) times daily with a meal.    . simvastatin (ZOCOR) 20 MG  tablet Take 40 mg by mouth every evening.     . vitamin C (ASCORBIC ACID) 500 MG tablet Take 500 mg by mouth daily.    . vitamin E 1000 UNIT capsule Take 1,000 Units by mouth daily.     No current facility-administered medications for this visit.       Review of Systems A complete review of systems was asked and was negative except for the following positive findings fatigue, diabetes, thyroid disease, rash, skin cancer, incontinence, stroke, joint pain, sinus problems  Blood pressure (!) 166/90, pulse 78, temperature 97.9 F (36.6 C), temperature source Skin, height 5\' 2"  (1.575 m), weight 142 lb (64.4 kg), SpO2 95 %.  Physical Exam CONSTITUTIONAL:  Pleasant, well-developed, well-nourished, and in no acute distress. EYES: Pupils equal and reactive to light, Sclera non-icteric EARS, NOSE, MOUTH AND THROAT:  The oropharynx was clear.  Dentition is good repair.  Oral mucosa pink and moist. LYMPH NODES:  Lymph nodes in the neck and axillae were normal RESPIRATORY:  Lungs were clear.  Normal respiratory effort without pathologic use of accessory muscles of respiration CARDIOVASCULAR: Heart was regular without murmurs.  There were no carotid bruits. GI: The abdomen was soft, nontender, and nondistended. There were no palpable masses. There was no hepatosplenomegaly. There were normal bowel sounds in all quadrants. GU:  Rectal deferred.   MUSCULOSKELETAL:  Normal muscle strength and tone.  No clubbing or cyanosis.   SKIN:  There were no pathologic skin lesions.  There were no nodules on palpation.  There is a right-sided mastectomy incision with what appears to be a separate axillary incision for lumpectomy and axillary dissection.  There is no evidence of recurrence on the chest wall NEUROLOGIC:  Sensation is normal.  Cranial nerves are grossly intact. PSYCH:  Oriented to person, place and time.  Mood and affect are normal.  Data Reviewed CT scans and PET scans  I have personally reviewed  the patient's imaging, laboratory findings and medical records.    Assessment    Enlarging right middle lobe mass most consistent with a presumed stage I carcinoma of the lung    Plan    I had a long discussion with her regarding the options at this point in time.  I do believe that she has a right middle lobe cancer.  I discussed with her the risks and advantages and disadvantages of lung biopsy.  We discussed the role of surgery.  We also discussed the need for preoperative evaluation including pulmonary function  testing and perhaps cardiac evaluation.  Her and her husband asked a extensive series of questions which I have answered.  They will review their options and be back in touch with me next week.  I did give them my business card and answered all their questions.  At the present time they felt comfortable with discussing their care over the weekend and will call next week.      Nestor Lewandowsky, MD 02/01/2019, 12:18 PM

## 2019-02-01 NOTE — Patient Instructions (Addendum)
Patient to call and report if they want to do the lung biopsy or lung removal.

## 2019-06-26 IMAGING — PT NUCLEAR MEDICINE PET IMAGE RESTAGING (PS) SKULL BASE TO THIGH
1 of 3 series · 8 of 30 positions shown, 10 images · non-contrast
Comparison: CT 12/24/2018

CLINICAL DATA: Initial treatment strategy for pulmonary nodule.
Remote history of breast cancer.

EXAM:
NUCLEAR MEDICINE PET SKULL BASE TO THIGH
TECHNIQUE: 8.0 mCi F-18 FDG was injected intravenously. Full-ring PET imaging
was performed from the skull base to thigh after the radiotracer. CT
data was obtained and used for attenuation correction and anatomic
localization.
Fasting blood glucose: 136 mg/dl

[Series 3: ct wb 5.0 b30f · axial · 0.98mm/px · z∈[-950,-278]mm · 8 of 290 slices shown, 10 images]
[im 33/290  brain]
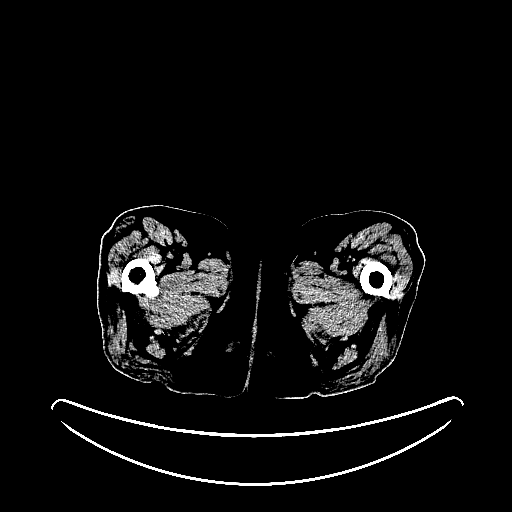
[im 33/290  bone]
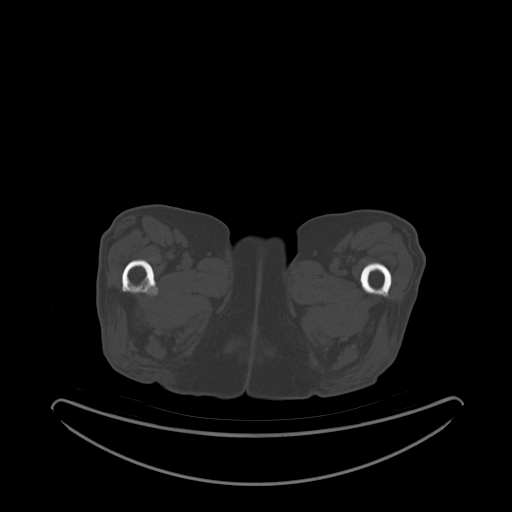
[im 65/290  brain]
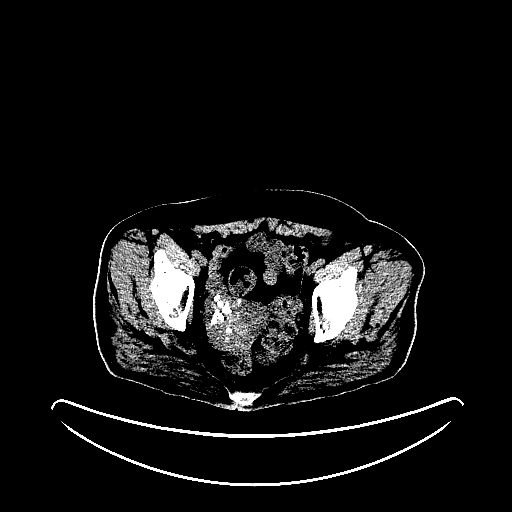
[im 97/290  brain]
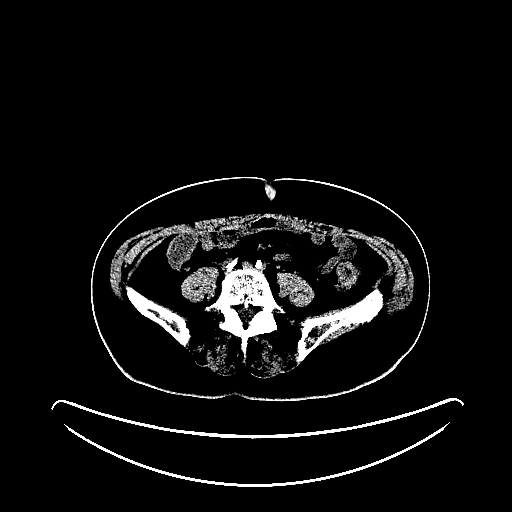
[im 129/290  brain]
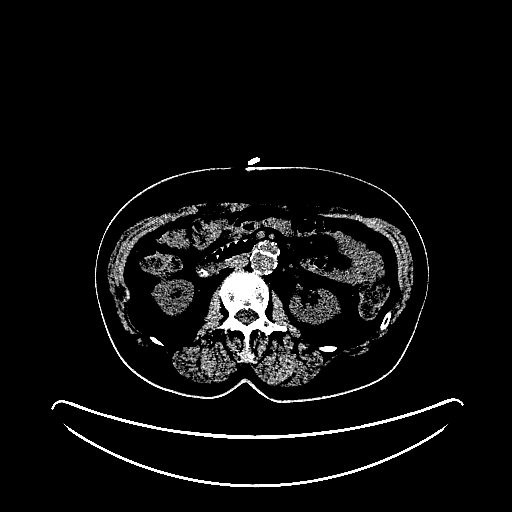
[im 161/290  brain]
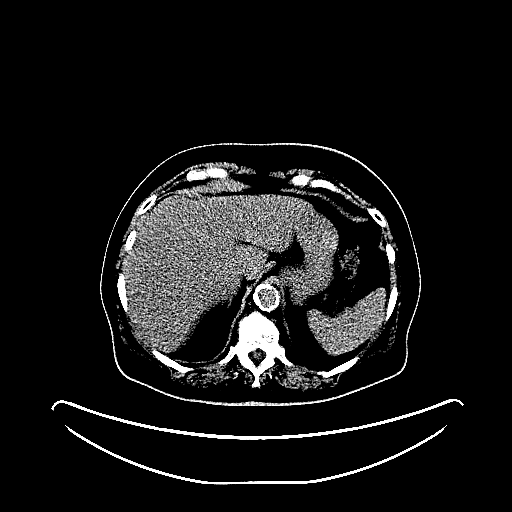
[im 161/290  bone]
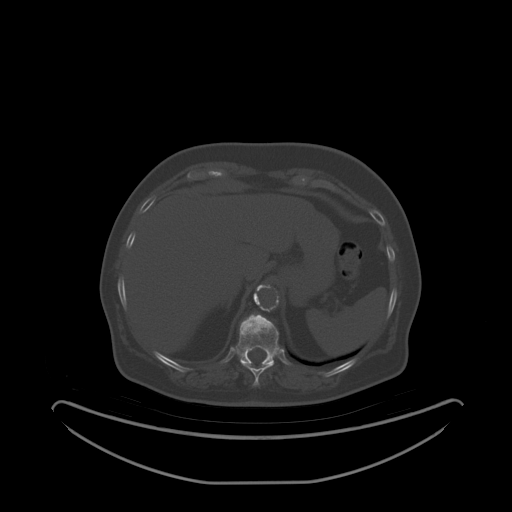
[im 193/290  brain]
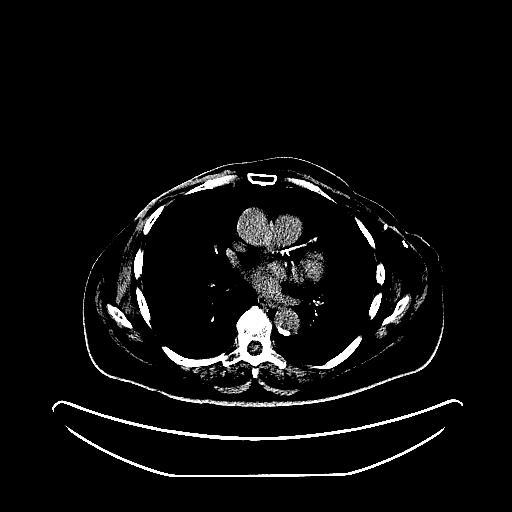
[im 225/290  brain]
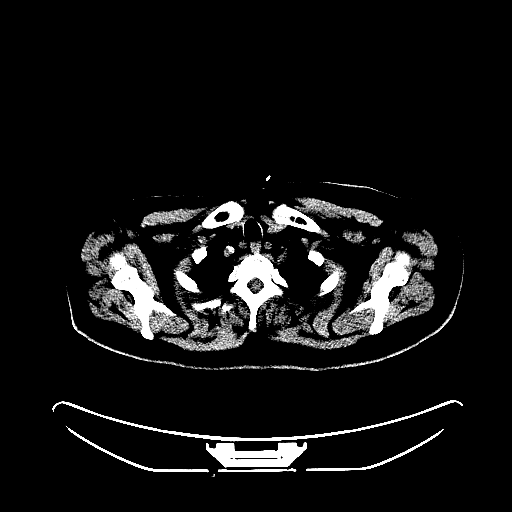
[im 257/290  brain]
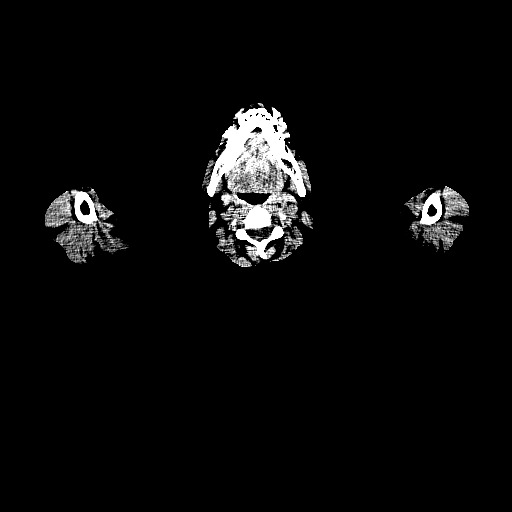

[8 of 30 positions shown; findings below may reference images not displayed]

FINDINGS: Mediastinal blood pool activity: SUV max

Liver activity: SUV max NA

NECK: No hypermetabolic lymph nodes in the neck.

Incidental CT findings: none

CHEST: Peripheral nodule in the RIGHT middle lobe measuring 15 mm x
15 mm (image 105/3) compared to 11 mm x 13 mm on CT 08/24/2018 and
16 mm x 15 mm on CT 12/24/2018 for no significant change. The lesion
has mild to moderate metabolic activity SUV max equal 3.2.

Paravertebral atelectasis in the medial RIGHT middle lobe is similar
without metabolic activity

No hypermetabolic mediastinal lymph nodes.

Incidental CT findings: Coronary artery calcification and aortic
atherosclerotic calcification.

ABDOMEN/PELVIS: No abnormal hypermetabolic activity within the
liver, pancreas, adrenal glands, or spleen. No hypermetabolic lymph
nodes in the abdomen or pelvis.

Incidental CT findings: none

SKELETON: No focal hypermetabolic activity to suggest skeletal
metastasis.

Incidental CT findings: none
IMPRESSION: 1. Persistent irregular nodule in the RIGHT middle lobe with
moderate metabolic activity. Differential include focus of
persistent infection/inflammation versus low-grade bronchogenic
adenocarcinoma. Metastatic breast cancer is not favored. Consider
tissue sampling as lesion is persistent and hypermetabolic.
2. No evidence of mediastinal lymphadenopathy.
3. No evidence distant metastatic disease.
4. Coronary artery calcification and Aortic Atherosclerosis
([id number]).

## 2023-03-09 ENCOUNTER — Other Ambulatory Visit (INDEPENDENT_AMBULATORY_CARE_PROVIDER_SITE_OTHER): Payer: Self-pay | Admitting: Nurse Practitioner

## 2023-03-09 DIAGNOSIS — I771 Stricture of artery: Secondary | ICD-10-CM

## 2023-03-15 ENCOUNTER — Encounter (INDEPENDENT_AMBULATORY_CARE_PROVIDER_SITE_OTHER): Payer: Medicare Other

## 2023-03-15 ENCOUNTER — Encounter (INDEPENDENT_AMBULATORY_CARE_PROVIDER_SITE_OTHER): Payer: Medicare Other | Admitting: Nurse Practitioner

## 2023-04-20 ENCOUNTER — Encounter (INDEPENDENT_AMBULATORY_CARE_PROVIDER_SITE_OTHER): Payer: Self-pay | Admitting: Nurse Practitioner

## 2023-04-20 ENCOUNTER — Ambulatory Visit (INDEPENDENT_AMBULATORY_CARE_PROVIDER_SITE_OTHER): Payer: Medicare Other

## 2023-04-20 ENCOUNTER — Ambulatory Visit (INDEPENDENT_AMBULATORY_CARE_PROVIDER_SITE_OTHER): Payer: Medicare Other | Admitting: Nurse Practitioner

## 2023-04-20 VITALS — BP 112/75 | HR 69 | Resp 18 | Ht 62.5 in | Wt 129.6 lb

## 2023-04-20 DIAGNOSIS — I771 Stricture of artery: Secondary | ICD-10-CM

## 2023-05-07 NOTE — Progress Notes (Signed)
Subjective:    Patient ID: Anna Frank, female    DOB: 1946-02-07, 77 y.o.   MRN: [id number] Chief Complaint  Patient presents with   New Patient (Initial Visit)    NP. carotid/consult. LSCA stenosis. no previous imaging available. sparks    The patient is a 77 year old female who presents today after having a recent CT scan for follow-up of her lung cancer Rose noted that she had concerning possible significant stenosis in her left subclavian artery.  This was a noncontrasted study.  The patient denies any dizziness or pain in her upper extremities.  Today her noninvasive studies show 1 to 39% stenosis in bilateral internal carotid arteries.  Her bilateral vertebral arteries have antegrade flow with normal flow hemodynamics in the bilateral subclavian arteries.  Additionally the velocities on the left subclavian artery are not elevated.    Review of Systems  Neurological:  Negative for dizziness and seizures.  All other systems reviewed and are negative.      Objective:   Physical Exam Vitals reviewed.  HENT:     Head: Normocephalic.  Cardiovascular:     Rate and Rhythm: Normal rate.     Pulses:          Radial pulses are 2+ on the right side and 2+ on the left side.  Pulmonary:     Effort: Pulmonary effort is normal.  Skin:    General: Skin is warm and dry.  Neurological:     Mental Status: She is alert and oriented to person, place, and time.  Psychiatric:        Mood and Affect: Mood normal.        Behavior: Behavior normal.        Thought Content: Thought content normal.        Judgment: Judgment normal.     BP 112/75 (BP Location: Right Arm)   Pulse 69   Resp 18   Ht 5' 2.5" (1.588 m)   Wt 129 lb 9.6 oz (58.8 kg)   BMI 23.33 kg/m   Past Medical History:  Diagnosis Date   Blood transfusion 1998   "stem cell related" (01/01/2013)   Breast cancer (HCC) 1998   right   Breast cancer (HCC) 01/01/2013   left   Family history of anesthesia complication     "one sister has PONV" (01/01/2013)   Hyperlipemia    Hypertension    PONV (postoperative nausea and vomiting)    Stroke (HCC) 1998   denies residual on 01/01/2013   Type II diabetes mellitus (HCC)     Social History   Socioeconomic History   Marital status: Married    Spouse name: Not on file   Number of children: Not on file   Years of education: Not on file   Highest education level: Not on file  Occupational History   Not on file  Tobacco Use   Smoking status: Former    Current packs/day: 0.00    Average packs/day: 1 pack/day for 20.0 years (20.0 ttl pk-yrs)    Types: Cigarettes    Start date: 08/18/1976    Quit date: 08/18/1996    Years since quitting: 26.7   Smokeless tobacco: Never  Substance and Sexual Activity   Alcohol use: Yes    Comment: 01/01/2013 "4oz glass of wine when I drink; none for 3-4 weeks"   Drug use: No   Sexual activity: Never  Other Topics Concern   Not on file  Social History Narrative  Not on file   Social Determinants of Health   Financial Resource Strain: Not on file  Food Insecurity: Not on file  Transportation Needs: Not on file  Physical Activity: Not on file  Stress: Not on file  Social Connections: Not on file  Intimate Partner Violence: Not on file    Past Surgical History:  Procedure Laterality Date   BONE MARROW TRANSPLANT  1998   "stem cell" (01/01/2013)   BREAST BIOPSY Bilateral    "lots over the years" (01/01/2013)   BREAST LUMPECTOMY  1998   LAPAROSCOPY N/A 09/19/2013   Procedure: LAPAROSCOPIC  BILATERAL SALPINGO OOPHORECTOMY WITH WASHINGS;  Surgeon: Jeani Hawking, MD;  Location: [facility];  Service: Gynecology;  Laterality: N/A;   MASTECTOMY Right 2007   MASTECTOMY COMPLETE / SIMPLE Left 01/01/2013   TONSILLECTOMY  ~ 1954   TOTAL MASTECTOMY Left 01/01/2013   Procedure: TOTAL MASTECTOMY;  Surgeon: Ernestene Mention, MD;  Location: [facility] OR;  Service: General;  Laterality: Left;    Family History  Problem Relation Age of  Onset   Heart disease Mother    Cancer Sister        ovarian   Kidney cancer Sister    Prostate cancer Paternal Uncle    Prostate cancer Paternal Grandfather    Breast cancer Cousin        4 paternal cousins with breast cancer in thier 79s    Allergies  Allergen Reactions   Citric Acid Other (See Comments)    Suggested by MD   Tryptophan Other (See Comments)    Affects bladder.   Tyrosine     Urinary irritation       Latest Ref Rng & Units 07/23/2015    1:59 PM 07/31/2014    1:07 PM 02/05/2014    2:34 PM  CBC  WBC 3.9 - 10.3 10e3/uL 8.3  6.2  7.0   Hemoglobin 11.6 - 15.9 g/dL 16.1  09.6  04.5   Hematocrit 34.8 - 46.6 % 33.6  35.5  37.2   Platelets 145 - 400 10e3/uL 282  268  244       CMP     Component Value Date/Time   NA 138 07/23/2015 1359   K 4.1 07/23/2015 1359   CL 98 09/17/2013 1210   CL 105 01/18/2013 0549   CL 101 12/03/2008 1046   CO2 22 07/23/2015 1359   GLUCOSE 154 (H) 07/23/2015 1359   GLUCOSE 169 (H) 12/03/2008 1046   BUN 9.7 07/23/2015 1359   CREATININE 0.8 07/23/2015 1359   CALCIUM 9.1 07/23/2015 1359   PROT 6.7 07/23/2015 1359   ALBUMIN 4.1 07/23/2015 1359   AST 19 07/23/2015 1359   ALT 17 07/23/2015 1359   ALKPHOS 61 07/23/2015 1359   BILITOT 0.65 07/23/2015 1359   EGFR 76 (L) 07/23/2015 1359   GFRNONAA >90 09/17/2013 1210   GFRNONAA >60 01/18/2013 0549     No results found.     Assessment & Plan:   1. Stenosis of left subclavian artery (HCC) Today noninvasive studies did not show any significant flow-limiting stenosis of left subclavian artery.  This is further corroborated that the patient's blood pressures are not showing a greater than 20 mmHg difference.  She is also asymptomatic as well.  Based on the the patient does not have any significant arterial disease which requires intervention at this time.  She should remain on statin, and aspirin and will follow-up with [place] as needed.  No falls   Current  Outpatient Medications on  File Prior to Visit  Medication Sig Dispense Refill   aspirin 81 MG tablet Take 81 mg by mouth daily.     Azelastine HCl 137 MCG/SPRAY SOLN Place into the nose.     b complex vitamins tablet Take 1 tablet by mouth daily.     B Complex-Folic Acid (B COMPLEX VITAMINS, W/ FA,) CAPS Take 1 capsule by mouth daily.     bisoprolol-hydrochlorothiazide (ZIAC) 2.5-6.25 MG tablet Take 1 tablet by mouth daily.     Calcium Carb-Cholecalciferol 500-10 MG-MCG TABS Take by mouth.     Continuous Glucose Sensor (FREESTYLE LIBRE 14 DAY SENSOR) MISC SMARTSIG:1 Topical Every 2 Weeks     cyanocobalamin (VITAMIN B12) 1000 MCG tablet Take by mouth.     desvenlafaxine (PRISTIQ) 50 MG 24 hr tablet Take 50 mg by mouth daily.     fish oil-omega-3 fatty acids 1000 MG capsule Take 1 g by mouth daily.     gabapentin (NEURONTIN) 100 MG capsule Take 1 capsule by mouth 2 (two) times daily.     insulin lispro (HUMALOG) 100 UNIT/ML KwikPen      irbesartan (AVAPRO) 150 MG tablet Take 150 mg by mouth daily.     levothyroxine (SYNTHROID) 50 MCG tablet Take 50 mcg by mouth.     magnesium oxide (MAG-OX) 400 MG tablet Take 400 mg by mouth daily.     OZEMPIC, 0.25 OR 0.5 MG/DOSE, 2 MG/3ML SOPN Inject into the skin.     simvastatin (ZOCOR) 20 MG tablet Take 40 mg by mouth every evening.      vitamin C (ASCORBIC ACID) 500 MG tablet Take 500 mg by mouth daily.     vitamin E 1000 UNIT capsule Take 1,000 Units by mouth daily.     HYDROcodone-acetaminophen (NORCO) 10-325 MG per tablet Take 1 tablet by mouth every 6 (six) hours as needed. (Patient not taking: Reported on 04/20/2023) 30 tablet 0   losartan (COZAAR) 100 MG tablet Take 100 mg by mouth daily. (Patient not taking: Reported on 04/20/2023)     No current facility-administered medications on file prior to visit.    There are no Patient Instructions on file for this visit. No follow-ups on file.   Georgiana Spinner, NP

## 2023-08-22 NOTE — Progress Notes (Signed)
 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 Wyn Salome Thresa Bernardino) Radiation Oncologist: Dr. Winnifred   Breast cancer [facility])  1998 Initial Diagnosis   Right sided breast cancer upper outter quadrant Invasive lobular carcinomaT2N2M0   1998 Surgery   Right breast lumpectomy   1998 -  Chemotherapy   Adjuvant high dose chemotherapy   1998 -  Radiation   Adjuvant RT Unknown dose because the records were purged   1998 Transplant   [facility]   1998 - 2003 Oncology Treatment   5 yr course of Letrozole   2007 Surgery   Recurrence invasive lobular carcinoma    ER(+) PR (-) 6mm invasive and 4mm insitu component  Salvage right mastectomy margin negative, followed by aromasin    2014 Initial Diagnosis   9mm nodule on MRI, excisional bx showing atypical ductal hyperplasia Left sided breast cancer   01/01/2013 Surgery   Left mastectomy   Non-small cell cancer of middle lobe of right lung (HCC)  08/24/2018 Imaging   CT chest  RML nodule  11mm x 13 mm   12/24/2018 Imaging   CT chest RML nodule  16x15 mm   01/07/2019 Imaging   PET Moderate avidity in RML nodule 15x15 mm  No evidence of mediastinal adenopathy No evidence of distant metastatic disesae   02/26/2019 - 03/04/2019 Radiation   SBRT to RML lesion - 50 Gy in 5 fractions    Patient Information Patient confirmed they are in the state of: [place]  Patient emergency contact number: husband Deward - [phone] Patient identified via: Home phone number, address Identification verified (DOB and Phone Number): yes  Consent I, distant provider, was located at  [facility], [place], [place].  This visit is being conducted over the telephone at the patient's request: Yes Patient gives  verbal consent to proceed and knows there may be a copay/deductible: Yes   Interval: 3 months since last visit and 4 years and  4 months since completion of radiation therapy   Interval History:   Since her last visit she  has had Medicare annual Wellness exam and routine exam with PCP, Dr. Auston, as well as annual GYN exam and follow-up with endocrinologist for T2DM and hypothyroidism.SABRA  PNo ED visits or hospitalizations.    ROS: General: Afebrile.  Decreased energy worse over past month. Appetite  decreased with start of Ozempic and persists. Continues to have sleep issues. HEENT: She denies new or differing headaches from her baseline.  She denies difficulty swallowing or aspiration concerns. Cardiopulmonary: She denies chest/thoracic pain. Hx: left  shoulder pain from previous racquetball accident-unchanged. No palpitations or syncopal episodes. She reports occasional  nonproductive cough within usual pattern and without hemoptysis. Some  increased shortness of breath with exertion She does not use any breathing medications or oxygen. She tells me her PCP was in process of having some cardiac testing done prior to her having to have radiation  but was placed on hold due to going through radiation. GI: Hx: GERD- denies nausea or vomiting.  Neuro: Reports some lightheaded at times (maybe 2-3 times past month.) Denies any recent falls related to this. Psych: She indicates  some grief from sister passing  last year but denies increase in increase in anxiety or depression. She maintains she  has good support systems.  Smoking: no   PHYSICAL EXAM:  Telehealth visit.  There were no vitals taken for this visit.  RADIOLOGY:   CT CHEST WO CONTRAST, 08/16/2023 11:10 AM   INDICATION:Lung Cancer Surveillance also hx: Breast Cancer, Malignant neoplasm of middle lobe, bronchus or lung (CMD) \ C34.2 Malignant neoplasm of middle lobe, bronchus or lung (CMD) \ C50.919 Malignant neoplasm of unspecified  site of unspecified female breast (CMD)    ADDITIONAL HISTORY: None.   COMPARISON: 02/02/2023.   TECHNIQUE: Multislice axial images were obtained through the chest without administration of iodinated intravenous contrast material. Multi-planar reformatted images were generated for additional analysis. Nongated technique limits cardiac detail.   All CT scans at [facility] and [facility] are performed using dose optimization techniques as appropriate to a performed exam, including but not limited to one or more of the following: automated exposure control, adjustment of the mA and/or kV according to patient size, use of iterative reconstruction technique. In addition, [facility] is participating in the ACR Dose Registry program which will further assist us  in optimizing patient radiation exposure.   FINDINGS:    Thoracic inlet/central airways: No suspicious thyroid nodule. Patent central airways.   Upper abdomen: No suspicious focal lesion in the visualized upper abdominal viscera.   Chest wall/MSK: Stable subcutaneous soft tissue thickening in the right breast. There is stable irregularity in the adjacent anterior right fourth rib. Bilateral mastectomy.   Mediastinum/hila/axilla: No adenopathy. Small hiatal hernia.   Heart/vessels: Mildly enlarged left atrium. No pericardial effusion. Aorta normal in caliber. There is heavy coronary artery calcification. Moderate aortic valvular calcifications. Atherosclerotic calcification of the thoracic aorta and the root of the arch vessels.   Lungs/pleura: Stable right middle lobe consolidative opacity with volume loss, architectural this torsion and bronchiectasis. Unchanged osteophyte related fibrosis in the paraspinal right lower lobe. Postradiation changes in the anterior right upper and middle lobes with subpleural reticulations. Stable 5 mm groundglass nodule in the left upper lobe (2:52).     IMPRESSION: *  No  new or progressive disease in the chest. Stable treated cancer in the right middle lobe. *  Stable left upper lobe 5 mm groundglass nodule. Differentials include focal fibrosis versus atypical adenomatous hyperplasia versus indolent lung cancer. Continued attention on follow-up is recommended.     I have personally reviewed the procedure note and/or have reviewed and interpreted this image/images. Electronically Signed By: Amada Hildy Carwin on 08/16/2023  4:56 PM   Impression/Plan:   1.Presumed NSCLC of the RML S/P SBRT (03/04/2019)- with stable post radiation changes no evidence of disease or progression at this time.     Today I spent 30 minutes with patient in face-to-face activities. 15 minutes were spent updating history, review of symptoms, physical exam an imaging review. The remaining 15 minutes was spent in educational counseling and care coordination.  Scheduled Future Appointments       Provider Department Dept Phone Center   08/22/2023  3:00 PM Amy Almarie Radish [facility] - Radiation Oncology Arrive at: [facility] Video/Phone Visit [phone] [facility] Comp Can

## 2024-02-02 ENCOUNTER — Telehealth: Payer: Self-pay | Admitting: *Deleted

## 2024-02-02 ENCOUNTER — Other Ambulatory Visit: Payer: Self-pay | Admitting: *Deleted

## 2024-02-02 ENCOUNTER — Inpatient Hospital Stay
Admission: RE | Admit: 2024-02-02 | Discharge: 2024-02-02 | Disposition: A | Discharge status: Home / Self Care | Payer: Self-pay | Source: Ambulatory Visit | Attending: Oncology | Admitting: Oncology

## 2024-02-02 DIAGNOSIS — Z170 Estrogen receptor positive status [ER+]: Secondary | ICD-10-CM

## 2024-02-02 NOTE — Progress Notes (Signed)
 Referral entered

## 2024-02-02 NOTE — Telephone Encounter (Signed)
 Anna Frank called to f/u on her referral placed on 7/01 by Anna Frank. Informed her it was forwarded to [facility] due to being closer to her home. She informed RN that she does not want to go to [place]. She wants to see Anna Frank and Anna Frank also wants Anna Frank to see her. She is transferring care from [facility] due to long drive. Not in treatment, but due her 6 month CT scan this month.  Informed her this call will be forwarded to navigator.

## 2024-02-13 ENCOUNTER — Inpatient Hospital Stay: Payer: Self-pay | Attending: Oncology | Admitting: Oncology

## 2024-02-13 VITALS — BP 116/75 | HR 90 | Temp 97.7°F | Resp 18 | Ht 62.5 in | Wt 124.5 lb

## 2024-02-13 DIAGNOSIS — C342 Malignant neoplasm of middle lobe, bronchus or lung: Secondary | ICD-10-CM | POA: Diagnosis not present

## 2024-02-13 DIAGNOSIS — Z79899 Other long term (current) drug therapy: Secondary | ICD-10-CM

## 2024-02-13 DIAGNOSIS — E119 Type 2 diabetes mellitus without complications: Secondary | ICD-10-CM | POA: Diagnosis not present

## 2024-02-13 DIAGNOSIS — Z853 Personal history of malignant neoplasm of breast: Secondary | ICD-10-CM

## 2024-02-13 DIAGNOSIS — I10 Essential (primary) hypertension: Secondary | ICD-10-CM | POA: Diagnosis not present

## 2024-02-13 DIAGNOSIS — E039 Hypothyroidism, unspecified: Secondary | ICD-10-CM

## 2024-02-13 DIAGNOSIS — C3490 Malignant neoplasm of unspecified part of unspecified bronchus or lung: Secondary | ICD-10-CM | POA: Insufficient documentation

## 2024-02-13 NOTE — Progress Notes (Signed)
 [facility] New Patient Consult   Requesting MD: Auston Reyes BIRCH, Md [address] [facility] [place],  [place] [address]   Anna  DEVERY Frank 78 y.o.  03-Jun-1946    Reason for Consult: Breast cancer, non-small cell lung cancer   HPI: [name] has a remote history of right sided breast cancer.  She was treated with SBRT to a right middle lobe lung nodule in August 2020.  The lesion was hypermetabolic and was not biopsied.  She has continued follow-up in radiation oncology at [facility] and last had a CT 08/16/2023.  There were stable treatment related changes in the right lung with a stable 5 mm groundglass left upper lobe nodule.    [name] has decided to transfer oncology surveillance to [facility].  She feels well at present.  Past Medical History:  Diagnosis Date   Blood transfusion 1998   stem cell related (01/01/2013)   Breast cancer (HCC) 1998   right   Breast cancer (HCC) 01/01/2013   left   Family history of anesthesia complication    one sister has PONV (01/01/2013)   Hyperlipemia    Hypertension    PONV (postoperative nausea and vomiting)    Stroke (HCC) 1998   denies residual on 01/01/2013   Type II diabetes mellitus (HCC)     .  Right middle lobe non-small cell lung cancer, stage Ia2 (cT1b,N0M0), status post SBRT 02/26/2019 - 03/04/2019, 50 Gray in 5 fractions  Past Surgical History:  Procedure Laterality Date   BONE MARROW TRANSPLANT  1998   stem cell (01/01/2013)   BREAST BIOPSY Bilateral    lots over the years (01/01/2013)   BREAST LUMPECTOMY  1998   LAPAROSCOPY N/A 09/19/2013   Procedure: LAPAROSCOPIC  BILATERAL SALPINGO OOPHORECTOMY WITH WASHINGS;  Surgeon: Rosaline LITTIE Cobble, MD;  Location: [facility];  Service: Gynecology;  Laterality: N/A;   MASTECTOMY Right 2007   MASTECTOMY COMPLETE / SIMPLE Left 01/01/2013   TONSILLECTOMY  ~ 1954   TOTAL MASTECTOMY Left 01/01/2013   Procedure: TOTAL MASTECTOMY;  Surgeon: Elon CHRISTELLA Pacini, MD;  Location: [facility] OR;  Service: General;  Laterality: Left;    Medications: Reviewed  Allergies:  Allergies  Allergen Reactions   Citric Acid Other (See Comments)    Suggested by MD   Tryptophan Other (See Comments)    Affects bladder.   Tyrosine     Urinary irritation    Family history: A sister had ovarian cancer.  3 female paternal cousins had breast cancer  Social History:   She lives with her husband in [place].  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  She quit smoking cigarettes in 1998.  She quit drinking wine in 2015.  No risk factor for HIV or hepatitis.  ROS:   Positives include: Frequent urination, firmness surrounding the right mastectomy scar  A complete ROS was otherwise negative.  Physical Exam:  Blood pressure 116/75, pulse 90, temperature 97.7 F (36.5 C), temperature source Temporal, resp. rate 18, height 5' 2.5 (1.588 m), weight 124 lb 8 oz (56.5 kg), SpO2 100%.  HEENT: Oropharynx without visible mass, neck without mass Lungs: Distant breath sounds, clear bilaterally, no respiratory distress Cardiac: Regular rate and rhythm Abdomen: No hepatosplenomegaly, midline abdominal bruit? Vascular: No leg edema Lymph nodes: No cervical, supraclavicular, axillary, or inguinal nodes Neurologic: Alert and oriented, the motor exam appears intact in the upper and lower extremities bilaterally Skin: Status post bilateral mastectomy.  Radiation tattoos surrounding the right mastectomy scar.  There is soft tissue thickening at the right anterior chest wall-surrounding the mastectomy scar, no nodules. Musculoskeletal: No spine tenderness   LAB:  CBC  Lab Results  Component Value Date   WBC 8.3 07/23/2015   HGB 10.3 (L) 07/23/2015   HCT 33.6 (L) 07/23/2015   MCV 71.8 (L) 07/23/2015   PLT 282 07/23/2015   NEUTROABS 5.2 07/23/2015        CMP  Lab Results  Component Value Date   NA 138 07/23/2015   K 4.1 07/23/2015   CL  98 09/17/2013   CO2 22 07/23/2015   GLUCOSE 154 (H) 07/23/2015   BUN 9.7 07/23/2015   CREATININE 0.8 07/23/2015   CALCIUM 9.1 07/23/2015   PROT 6.7 07/23/2015   ALBUMIN 4.1 07/23/2015   AST 19 07/23/2015   ALT 17 07/23/2015   ALKPHOS 61 07/23/2015   BILITOT 0.65 07/23/2015   GFRNONAA >90 09/17/2013   GFRAA >90 09/17/2013      Imaging: As per HPI   Assessment/Plan:   Right breast cancer-1998 Right lumpectomy and axillary lymph node dissection, stage IIIa (T2 N2) invasive lobular carcinoma ER positive, PR positive, chemotherapy followed by autologous stem cell transplant at [facility] followed by radiation and 5 years of letrozole 09/13/2005 right simple mastectomy for ampT1b invasive lobular breast cancer, grade 2, ER positive, PR negative, HER2 negative, treated with adjuvant exemestane for 6-7 years  01/01/2013 left mastectomy and axillary lymph node sampling-atypical lobular hyperplasia  3.   BSO 09/29/2013-benign pathology  4.  Right middle lobe presumed non-small cell lung cancer, stage Ia2 (cT1b,N0,M0) 01/07/2019 PET-moderate avidity in right middle lobe nodule, 15 x 15 mm, no evidence of mediastinal adenopathy, no evidence of metastatic disease SBRT to right middle lobe lesion 02/26/2019 - 03/04/2019, 50 Gray in 5 fractions 08/16/2023 CT chest: No new or progressive disease, stable treated right middle lobe cancer, stable 5 mm groundglass left upper lobe nodule  5.  Diabetes 6.  Hypertension 7.  CVA in 1998 8.  Hypothyroidism 9.  Abdominal bruit on exam 02/13/2024-[name] will discuss with Dr. Auston at the office visit in August 2025  Disposition:   [name] has a remote history of breast cancer.  She remains in clinical remission.  She was treated with SBRT for a presumed right middle lobe non-small cell lung cancer 2020.  She has been followed with surveillance imaging.  The most recent CT 08/16/2023 revealed no evidence of recurrent disease.  There is a stable left  upper lobe nodule.  [name] would like to continue surveillance imaging.  She will be scheduled for a noncontrast chest CT within the next few weeks.  She appears to have an abdominal bruit on exam.  She will discuss the indication for imaging Dr. Auston when she sees him within the next few months.  She will return for an office visit in 6 months.  Arley Hof, MD  02/13/2024, 5:02 PM

## 2024-02-20 ENCOUNTER — Ambulatory Visit
Admission: RE | Admit: 2024-02-20 | Discharge: 2024-02-20 | Disposition: A | Discharge status: Home / Self Care | Source: Ambulatory Visit | Attending: Oncology | Admitting: Oncology

## 2024-02-20 DIAGNOSIS — C342 Malignant neoplasm of middle lobe, bronchus or lung: Secondary | ICD-10-CM | POA: Diagnosis present

## 2024-02-23 ENCOUNTER — Encounter: Payer: Self-pay | Admitting: Internal Medicine

## 2024-02-23 ENCOUNTER — Other Ambulatory Visit: Payer: Self-pay | Admitting: Internal Medicine

## 2024-02-23 DIAGNOSIS — R0989 Other specified symptoms and signs involving the circulatory and respiratory systems: Secondary | ICD-10-CM

## 2024-02-27 ENCOUNTER — Ambulatory Visit: Payer: Self-pay | Admitting: Oncology

## 2024-02-27 NOTE — Telephone Encounter (Signed)
-----   Message from Arley Hof sent at 02/27/2024  7:10 AM EDT ----- Please call patient, the CT is stable, no evidence of recurrent cancer, f/u as scheduled  ----- Message ----- From: Interface, Rad Results In Sent: 02/23/2024   3:03 PM EDT To: Arley KATHEE Hof, MD

## 2024-02-27 NOTE — Telephone Encounter (Signed)
 Patient gave verbal understanding and had no further questions or concerns

## 2024-02-29 ENCOUNTER — Ambulatory Visit
Admission: RE | Admit: 2024-02-29 | Discharge: 2024-02-29 | Disposition: A | Discharge status: Home / Self Care | Source: Ambulatory Visit | Attending: Internal Medicine | Admitting: Internal Medicine

## 2024-02-29 DIAGNOSIS — R0989 Other specified symptoms and signs involving the circulatory and respiratory systems: Secondary | ICD-10-CM | POA: Insufficient documentation

## 2024-02-29 DIAGNOSIS — I700 Atherosclerosis of aorta: Secondary | ICD-10-CM | POA: Diagnosis not present

## 2024-02-29 DIAGNOSIS — I7143 Infrarenal abdominal aortic aneurysm, without rupture: Secondary | ICD-10-CM | POA: Insufficient documentation

## 2024-04-04 ENCOUNTER — Telehealth: Payer: Self-pay | Admitting: Oncology

## 2024-04-05 ENCOUNTER — Telehealth: Payer: Self-pay | Admitting: *Deleted

## 2024-04-05 NOTE — Telephone Encounter (Signed)
 Received denial letter and forwarded to Rexene Seip in managed care.

## 2024-04-05 NOTE — Telephone Encounter (Signed)
 Patient has letter that her recent CT was denied by [facility]. Per managed care: not showing denied on our end. She will have to follow up with billing or bring in the denial letter and managed care will get mgr to review it.  Anna Frank will fax letter to office.

## 2024-04-18 ENCOUNTER — Other Ambulatory Visit (INDEPENDENT_AMBULATORY_CARE_PROVIDER_SITE_OTHER): Payer: Self-pay | Admitting: Nurse Practitioner

## 2024-04-18 DIAGNOSIS — I6523 Occlusion and stenosis of bilateral carotid arteries: Secondary | ICD-10-CM

## 2024-04-19 ENCOUNTER — Ambulatory Visit (INDEPENDENT_AMBULATORY_CARE_PROVIDER_SITE_OTHER): Payer: Medicare Other | Admitting: Nurse Practitioner

## 2024-04-19 ENCOUNTER — Encounter (INDEPENDENT_AMBULATORY_CARE_PROVIDER_SITE_OTHER): Payer: Medicare Other

## 2024-05-16 ENCOUNTER — Other Ambulatory Visit: Payer: Self-pay | Admitting: Internal Medicine

## 2024-05-16 DIAGNOSIS — R1031 Right lower quadrant pain: Secondary | ICD-10-CM

## 2024-05-16 DIAGNOSIS — E1159 Type 2 diabetes mellitus with other circulatory complications: Secondary | ICD-10-CM

## 2024-05-30 ENCOUNTER — Ambulatory Visit
Admission: RE | Admit: 2024-05-30 | Discharge: 2024-05-30 | Disposition: A | Discharge status: Home / Self Care | Source: Ambulatory Visit | Attending: Internal Medicine | Admitting: Internal Medicine

## 2024-05-30 DIAGNOSIS — E1159 Type 2 diabetes mellitus with other circulatory complications: Secondary | ICD-10-CM | POA: Insufficient documentation

## 2024-05-30 DIAGNOSIS — R1031 Right lower quadrant pain: Secondary | ICD-10-CM | POA: Diagnosis present

## 2024-05-30 DIAGNOSIS — I152 Hypertension secondary to endocrine disorders: Secondary | ICD-10-CM | POA: Diagnosis present

## 2024-05-30 MED ORDER — IOHEXOL 300 MG/ML  SOLN
85.0000 mL | Freq: Once | INTRAMUSCULAR | Status: AC | PRN
  Administered 2024-05-30: 85 mL via INTRAVENOUS

## 2024-08-15 ENCOUNTER — Inpatient Hospital Stay: Attending: Oncology | Admitting: Oncology

## 2024-08-15 VITALS — BP 136/84 | HR 80 | Temp 97.9°F | Resp 18 | Ht 62.5 in | Wt 126.6 lb

## 2024-08-15 DIAGNOSIS — C342 Malignant neoplasm of middle lobe, bronchus or lung: Secondary | ICD-10-CM | POA: Diagnosis not present

## 2024-08-15 NOTE — Progress Notes (Signed)
 " [facility] OFFICE PROGRESS NOTE   Diagnosis: Breast cancer, lung cancer  INTERVAL HISTORY:   Anna Frank returns as scheduled.  She generally feels well.  Good appetite.  She relates weight loss to Ozempic.  She recently had an upper respiratory infection with a cough.  She completed a course of amoxicillin with improvement.  No change over the chest wall.  She had a CT abdomen/pelvis in November 2025.  A 3.3 cm infrarenal aortic aneurysm was noted.  Increased from 3.1 cm in 2007.  Objective:  Vital signs in last 24 hours:  Blood pressure (!) 129/92, pulse 80, temperature 97.9 F (36.6 C), temperature source Temporal, resp. rate 18, height 5' 2.5 (1.588 m), weight 126 lb 9.6 oz (57.4 kg), SpO2 100%.   Lymphatics: No cervical, supraclavicular, or axillary nodes Resp: Lungs clear bilaterally, no respiratory distress Cardio: Regular rate and rhythm GI: No hepatosplenomegaly Vascular: No leg edema Breast: Bilateral mastectomy, thickening and hyperpigmentation surrounding the right mastectomy scar.  No evidence of chest wall tumor recurrence.  Lab Results:  Lab Results  Component Value Date   WBC 8.3 07/23/2015   HGB 10.3 (L) 07/23/2015   HCT 33.6 (L) 07/23/2015   MCV 71.8 (L) 07/23/2015   PLT 282 07/23/2015   NEUTROABS 5.2 07/23/2015    CMP  Lab Results  Component Value Date   NA 138 07/23/2015   K 4.1 07/23/2015   CL 98 09/17/2013   CO2 22 07/23/2015   GLUCOSE 154 (H) 07/23/2015   BUN 9.7 07/23/2015   CREATININE 0.8 07/23/2015   CALCIUM 9.1 07/23/2015   PROT 6.7 07/23/2015   ALBUMIN 4.1 07/23/2015   AST 19 07/23/2015   ALT 17 07/23/2015   ALKPHOS 61 07/23/2015   BILITOT 0.65 07/23/2015   GFRNONAA >90 09/17/2013   GFRAA >90 09/17/2013    Medications: I have reviewed the patient's current medications.   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   BSO 09/29/2013-benign pathology  4.  Right middle lobe presumed non-small cell lung cancer, stage Ia2 (cT1b,N0,M0) 01/07/2019 PET-moderate avidity in right middle lobe nodule, 15 x 15 mm, no evidence of mediastinal adenopathy, no evidence of metastatic disease SBRT to right middle lobe lesion 02/26/2019 - 03/04/2019, 50 Gray in 5 fractions 08/16/2023 CT chest: No new or progressive disease, stable treated right middle lobe cancer, stable 5 mm groundglass left upper lobe nodule 02/20/2024 CT chest: Masslike consolidation in the right middle lobe, stable favored postradiation change, no new suspicious pulmonary nodule or mass  5.  Diabetes 6.  Hypertension 7.  CVA in 1998 8.  Hypothyroidism 9.  Abdominal bruit on exam 02/13/2024-05/30/2024 CT Abdo/pelvis: 3.3 cm infrarenal abdominal aortic aneurysm, increased from 3.1 cm in 2007    Disposition: Ms. Anna Frank is in clinical remission from breast and lung cancer.  She would like to continue follow-up in the medical oncology clinic.  She will undergo a surveillance chest CT prior to an office visit in 6 months.  She will continue follow-up with Dr. Auston for the aortic aneurysm.  Arley Hof, MD  08/15/2024  12:16 PM   "

## 2024-08-21 ENCOUNTER — Other Ambulatory Visit: Payer: Self-pay | Admitting: Internal Medicine

## 2024-08-21 DIAGNOSIS — R911 Solitary pulmonary nodule: Secondary | ICD-10-CM

## 2024-08-22 ENCOUNTER — Telehealth: Payer: Self-pay | Admitting: *Deleted

## 2024-08-22 NOTE — Telephone Encounter (Signed)
 Jayra  left VM asking for Dr. Cloretta to look at CXR report from Dr. Auston done on 08/15/24. He has ordered a CT scan chest on 09/03/24. Asking for Dr. Andriette input and if she should proceed w/CT scan before the one he ordered in August.

## 2024-08-23 NOTE — Telephone Encounter (Signed)
 Informed Anna Frank that Dr. Cloretta reviewed CXR, but difficult to be definite without seeing images. He said could likely be radiation changes. Doing CT now is a reasonable step. She plans to get a CD of the CXR from [facility] radiology department to bring to Dr. Cloretta. Informed her that nurse will be looking for her CT results.

## 2024-09-03 ENCOUNTER — Ambulatory Visit
